# Patient Record
Sex: Male | Born: 1960 | Race: White | Hispanic: No | Smoking: Never smoker
Health system: Southern US, Community
[De-identification: ages and names within clinical notes are randomized; demographics above are authoritative.]

## PROBLEM LIST (undated history)

## (undated) DIAGNOSIS — I10 Essential (primary) hypertension: Secondary | ICD-10-CM

## (undated) DIAGNOSIS — N200 Calculus of kidney: Secondary | ICD-10-CM

## (undated) HISTORY — PX: ANKLE SURGERY: SHX546

## (undated) HISTORY — PX: LITHOTRIPSY: SUR834

---

## 1997-09-20 ENCOUNTER — Emergency Department (HOSPITAL_COMMUNITY): Admission: EM | Admit: 1997-09-20 | Discharge: 1997-09-20 | Payer: Self-pay | Admitting: Internal Medicine

## 1997-09-24 ENCOUNTER — Emergency Department (HOSPITAL_COMMUNITY): Admission: EM | Admit: 1997-09-24 | Discharge: 1997-09-24 | Payer: Self-pay | Admitting: Emergency Medicine

## 2007-10-20 ENCOUNTER — Emergency Department (HOSPITAL_COMMUNITY): Admission: EM | Admit: 2007-10-20 | Discharge: 2007-10-20 | Payer: Self-pay | Admitting: Emergency Medicine

## 2010-03-12 ENCOUNTER — Encounter: Payer: Self-pay | Admitting: Emergency Medicine

## 2010-11-21 LAB — CBC
Platelets: 285
RDW: 13.6

## 2010-11-21 LAB — COMPREHENSIVE METABOLIC PANEL
ALT: 19
Albumin: 3.6
Alkaline Phosphatase: 69
Potassium: 3.9
Sodium: 138
Total Protein: 6.7

## 2010-11-21 LAB — DIFFERENTIAL
Basophils Relative: 1
Eosinophils Absolute: 0.1
Monocytes Absolute: 0.6
Monocytes Relative: 7
Neutro Abs: 5.9

## 2010-11-21 LAB — CARDIAC PANEL(CRET KIN+CKTOT+MB+TROPI)
CK, MB: 0.9
Total CK: 71
Troponin I: 0.03

## 2012-05-20 DIAGNOSIS — C4491 Basal cell carcinoma of skin, unspecified: Secondary | ICD-10-CM

## 2012-05-20 DIAGNOSIS — C439 Malignant melanoma of skin, unspecified: Secondary | ICD-10-CM

## 2012-05-20 HISTORY — DX: Basal cell carcinoma of skin, unspecified: C44.91

## 2012-05-20 HISTORY — DX: Malignant melanoma of skin, unspecified: C43.9

## 2012-10-28 ENCOUNTER — Observation Stay (HOSPITAL_COMMUNITY)
Admission: EM | Admit: 2012-10-28 | Discharge: 2012-10-30 | Disposition: A | Discharge status: Home / Self Care | Payer: Self-pay | Attending: Family Medicine | Admitting: Family Medicine

## 2012-10-28 DIAGNOSIS — I161 Hypertensive emergency: Secondary | ICD-10-CM | POA: Diagnosis present

## 2012-10-28 DIAGNOSIS — E876 Hypokalemia: Secondary | ICD-10-CM | POA: Insufficient documentation

## 2012-10-28 DIAGNOSIS — Z9119 Patient's noncompliance with other medical treatment and regimen: Secondary | ICD-10-CM | POA: Insufficient documentation

## 2012-10-28 DIAGNOSIS — I658 Occlusion and stenosis of other precerebral arteries: Secondary | ICD-10-CM | POA: Insufficient documentation

## 2012-10-28 DIAGNOSIS — Z9114 Patient's other noncompliance with medication regimen: Secondary | ICD-10-CM

## 2012-10-28 DIAGNOSIS — R209 Unspecified disturbances of skin sensation: Secondary | ICD-10-CM | POA: Insufficient documentation

## 2012-10-28 DIAGNOSIS — R42 Dizziness and giddiness: Secondary | ICD-10-CM | POA: Insufficient documentation

## 2012-10-28 DIAGNOSIS — E785 Hyperlipidemia, unspecified: Secondary | ICD-10-CM | POA: Insufficient documentation

## 2012-10-28 DIAGNOSIS — I6529 Occlusion and stenosis of unspecified carotid artery: Secondary | ICD-10-CM | POA: Insufficient documentation

## 2012-10-28 DIAGNOSIS — Z91148 Patient's other noncompliance with medication regimen for other reason: Secondary | ICD-10-CM

## 2012-10-28 DIAGNOSIS — R599 Enlarged lymph nodes, unspecified: Secondary | ICD-10-CM | POA: Insufficient documentation

## 2012-10-28 DIAGNOSIS — M4802 Spinal stenosis, cervical region: Secondary | ICD-10-CM | POA: Insufficient documentation

## 2012-10-28 DIAGNOSIS — R531 Weakness: Secondary | ICD-10-CM | POA: Diagnosis present

## 2012-10-28 DIAGNOSIS — Z79899 Other long term (current) drug therapy: Secondary | ICD-10-CM | POA: Insufficient documentation

## 2012-10-28 DIAGNOSIS — R29898 Other symptoms and signs involving the musculoskeletal system: Secondary | ICD-10-CM | POA: Insufficient documentation

## 2012-10-28 DIAGNOSIS — Z91199 Patient's noncompliance with other medical treatment and regimen due to unspecified reason: Secondary | ICD-10-CM | POA: Insufficient documentation

## 2012-10-28 DIAGNOSIS — G459 Transient cerebral ischemic attack, unspecified: Secondary | ICD-10-CM

## 2012-10-28 DIAGNOSIS — I10 Essential (primary) hypertension: Principal | ICD-10-CM | POA: Insufficient documentation

## 2012-10-28 HISTORY — DX: Essential (primary) hypertension: I10

## 2012-10-29 ENCOUNTER — Emergency Department (HOSPITAL_COMMUNITY): Payer: Self-pay

## 2012-10-29 ENCOUNTER — Observation Stay (HOSPITAL_COMMUNITY): Payer: Self-pay

## 2012-10-29 ENCOUNTER — Encounter (HOSPITAL_COMMUNITY): Payer: Self-pay | Admitting: Family Medicine

## 2012-10-29 ENCOUNTER — Other Ambulatory Visit (HOSPITAL_COMMUNITY): Payer: Self-pay

## 2012-10-29 DIAGNOSIS — G8190 Hemiplegia, unspecified affecting unspecified side: Secondary | ICD-10-CM

## 2012-10-29 DIAGNOSIS — Z91148 Patient's other noncompliance with medication regimen for other reason: Secondary | ICD-10-CM

## 2012-10-29 DIAGNOSIS — R531 Weakness: Secondary | ICD-10-CM | POA: Diagnosis present

## 2012-10-29 DIAGNOSIS — Z9114 Patient's other noncompliance with medication regimen: Secondary | ICD-10-CM

## 2012-10-29 DIAGNOSIS — Z9119 Patient's noncompliance with other medical treatment and regimen: Secondary | ICD-10-CM

## 2012-10-29 DIAGNOSIS — I161 Hypertensive emergency: Secondary | ICD-10-CM | POA: Diagnosis present

## 2012-10-29 DIAGNOSIS — I10 Essential (primary) hypertension: Principal | ICD-10-CM

## 2012-10-29 LAB — BASIC METABOLIC PANEL
BUN: 14 mg/dL (ref 6–23)
Calcium: 8.7 mg/dL (ref 8.4–10.5)
Creatinine, Ser: 1.11 mg/dL (ref 0.50–1.35)
GFR calc Af Amer: 85 mL/min — ABNORMAL LOW (ref >90)
GFR calc non Af Amer: 74 mL/min — ABNORMAL LOW (ref >90)
Potassium: 3.4 mEq/L — ABNORMAL LOW (ref 3.5–5.1)

## 2012-10-29 LAB — CBC WITH DIFFERENTIAL/PLATELET
Basophils Relative: 1 % (ref 0–1)
Eosinophils Absolute: 0.2 10*3/uL (ref 0.0–0.7)
Eosinophils Relative: 4 % (ref 0–5)
Hemoglobin: 13.4 g/dL (ref 13.0–17.0)
MCH: 26.6 pg (ref 26.0–34.0)
MCHC: 32.4 g/dL (ref 30.0–36.0)
MCV: 82.1 fL (ref 78.0–100.0)
Monocytes Absolute: 0.5 10*3/uL (ref 0.1–1.0)
Monocytes Relative: 9 % (ref 3–12)
Neutrophils Relative %: 51 % (ref 43–77)

## 2012-10-29 LAB — LIPID PANEL
Cholesterol: 204 mg/dL — ABNORMAL HIGH (ref 0–200)
Total CHOL/HDL Ratio: 6.8 RATIO
Triglycerides: 242 mg/dL — ABNORMAL HIGH (ref <150)

## 2012-10-29 LAB — URINALYSIS, ROUTINE W REFLEX MICROSCOPIC
Bilirubin Urine: NEGATIVE
Hgb urine dipstick: NEGATIVE
Ketones, ur: NEGATIVE mg/dL
Nitrite: NEGATIVE
Protein, ur: NEGATIVE mg/dL
Urobilinogen, UA: 0.2 mg/dL (ref 0.0–1.0)

## 2012-10-29 LAB — HEMOGLOBIN A1C: Mean Plasma Glucose: 131 mg/dL — ABNORMAL HIGH (ref <117)

## 2012-10-29 MED ORDER — POTASSIUM CHLORIDE CRYS ER 20 MEQ PO TBCR
40.0000 meq | EXTENDED_RELEASE_TABLET | Freq: Once | ORAL | Status: AC
  Administered 2012-10-29: 18:00:00 40 meq via ORAL
  Filled 2012-10-29: qty 2

## 2012-10-29 MED ORDER — ASPIRIN 325 MG PO TABS
325.0000 mg | ORAL_TABLET | Freq: Every day | ORAL | Status: DC
  Administered 2012-10-29 – 2012-10-30 (×2): 325 mg via ORAL
  Filled 2012-10-29 (×2): qty 1

## 2012-10-29 MED ORDER — IOHEXOL 350 MG/ML SOLN
100.0000 mL | Freq: Once | INTRAVENOUS | Status: AC | PRN
  Administered 2012-10-29: 19:00:00 100 mL via INTRAVENOUS

## 2012-10-29 MED ORDER — METOPROLOL TARTRATE 25 MG PO TABS
25.0000 mg | ORAL_TABLET | Freq: Two times a day (BID) | ORAL | Status: DC
  Administered 2012-10-29 – 2012-10-30 (×3): 25 mg via ORAL
  Filled 2012-10-29 (×4): qty 1

## 2012-10-29 MED ORDER — BENAZEPRIL HCL 20 MG PO TABS
20.0000 mg | ORAL_TABLET | Freq: Every day | ORAL | Status: DC
  Administered 2012-10-29 – 2012-10-30 (×2): 20 mg via ORAL
  Filled 2012-10-29 (×2): qty 1

## 2012-10-29 MED ORDER — ASPIRIN 325 MG PO TABS
325.0000 mg | ORAL_TABLET | Freq: Every day | ORAL | Status: DC
  Filled 2012-10-29: qty 1

## 2012-10-29 MED ORDER — ASPIRIN 81 MG PO CHEW
324.0000 mg | CHEWABLE_TABLET | Freq: Once | ORAL | Status: AC
  Administered 2012-10-29: 324 mg via ORAL
  Filled 2012-10-29: qty 4

## 2012-10-29 MED ORDER — ASPIRIN 300 MG RE SUPP
300.0000 mg | Freq: Every day | RECTAL | Status: DC
  Filled 2012-10-29 (×2): qty 1

## 2012-10-29 NOTE — Progress Notes (Signed)
Writer received call from MRI that pt might be too big for facility MRI machine. Writer spoke with pt who stated that he cannot fit into the small MRI machine because his shoulders are too wide and that he was unable to have a MRI "before" because he is too broad. Writer reported this to MRI, and test was cancelled. Writer also reported to MD's [place] and [place],Neurology.

## 2012-10-29 NOTE — Progress Notes (Signed)
Recd's from ER condition stable. No s/sx related to Neuro compromise noted at this time.Continue with pla of care. Await ICU RN to complete the swallowing Eval

## 2012-10-29 NOTE — Progress Notes (Signed)
Subjective: Patient admitted this morning with transient left side numbness and tingling, all symptoms have resolved at this time. CT head is negative for stroke. Neurology has been consulted. MRA could not be performed due to patient's size and unavailability of open MRI in the [facility] system. Filed Vitals:   10/29/12 1338  BP: 150/91  Pulse: 72  Temp: 97.7 F (36.5 C)  Resp: 20    Chest: Clear Bilaterally Heart : S1S2 RRR Abdomen: Soft, nontender Ext : No edema Neuro: Alert, oriented x 3  A/P Transient numbness and weakness of left side now resolved Hypertensive urgency  Patient to undergo CTA of the head and neck to look for possible dissection If negative patient will be discharged home on aspirin We'll continue him on Lotensin and Lopressor    [employer] [facility]/ Pager- [phone]

## 2012-10-29 NOTE — Consult Note (Signed)
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  He was laying down hooking a car up last night when he stood up quickly. He became dizzy followed by a sensation both tingling and weakness in his left arm and leg. this lasted for about 30 minutes and completely resolved.  He currently is asymptomatic.  He denies taking ASA daily.    Date last known well: Date: 10/28/2012 Time last known well: Unable to determine tPA Given: No: symptoms resolved.   Past Medical History  Diagnosis Date  . Hypertension     Past Surgical History  Procedure Laterality Date  . Ankle surgery    . Lithotripsy      Family History  Problem Relation Age of Onset  . Hypertension Mother   . Hypertension Father   . Hypertension Father    Social History:  reports that he has never smoked. He has never used smokeless tobacco. He reports that he does not drink alcohol or use illicit drugs.  Allergies: No Known Allergies  Medications:                                                                                                                           Prior to Admission:  Prescriptions prior to admission  Medication Sig Dispense Refill  . aspirin 325 MG tablet Take 325 mg by mouth daily.      . benazepril (LOTENSIN) 20 MG tablet Take 20 mg by mouth daily.      . metoprolol tartrate (LOPRESSOR) 25 MG tablet Take 25 mg by mouth 2 (two) times daily.       Scheduled: . aspirin  300 mg Rectal Daily   Or  . aspirin  325 mg Oral Daily  . benazepril  20 mg Oral Daily  . metoprolol tartrate  25 mg Oral BID    ROS:  History  obtained from the patient  General ROS: negative for - chills, fatigue, fever, night sweats, weight gain or weight loss Psychological ROS: negative for - behavioral disorder, hallucinations, memory difficulties, mood swings or suicidal ideation Ophthalmic ROS: negative for - blurry vision, double vision, eye pain or loss of vision ENT ROS: negative for - epistaxis, nasal discharge, oral lesions, sore throat, tinnitus or vertigo Allergy and Immunology ROS: negative for - hives or itchy/watery eyes Hematological and Lymphatic ROS: negative for - bleeding problems, bruising or swollen lymph nodes Endocrine ROS: negative for - galactorrhea, hair pattern changes, polydipsia/polyuria or temperature intolerance Respiratory ROS: negative for - cough, hemoptysis, shortness of breath or wheezing Cardiovascular ROS: negative for - chest pain, dyspnea on exertion, edema or irregular heartbeat Gastrointestinal ROS: negative for - abdominal pain, diarrhea, hematemesis, nausea/vomiting or stool incontinence Genito-Urinary ROS: negative for - dysuria, hematuria, incontinence or urinary frequency/urgency Musculoskeletal ROS: negative for - joint swelling or muscular weakness Neurological ROS: as noted in HPI Dermatological ROS: negative for rash and skin lesion changes  Neurologic Examination:                                                                                                      Blood pressure 150/91, pulse 72, temperature 97.7 F (36.5 C), temperature source Oral, resp. rate 20, height 6\' 6"  (1.981 m), weight 157 kg (346 lb 2 oz), SpO2 96.00%.  Mental Status: Alert, oriented, thought content appropriate.  Speech fluent without evidence of aphasia.  Able to follow 3 step commands without difficulty. Cranial Nerves: II: Discs flat bilaterally; Visual fields grossly normal, pupils equal, round, reactive to light and accommodation III,IV, VI: ptosis not present, extra-ocular motions intact  bilaterally V,VII: smile symmetric, facial light touch sensation normal bilaterally VIII: hearing normal bilaterally IX,X: gag reflex present XI: bilateral shoulder shrug XII: midline tongue extension Motor: Right : Upper extremity   5/5    Left:     Upper extremity   5/5  Lower extremity   5/5     Lower extremity   5/5 Tone and bulk:normal tone throughout; no atrophy noted Sensory: Pinprick and light touch intact throughout, bilaterally Deep Tendon Reflexes:  Right: Upper Extremity   Left: Upper extremity   biceps (C-5 to C-6) 2/4   biceps (C-5 to C-6) 2/4 tricep (C7) 2/4    triceps (C7) 2/4 Brachioradialis (C6) 2/4  Brachioradialis (C6) 2/4  Lower Extremity Lower Extremity  quadriceps (L-2 to L-4) 2/4   quadriceps (L-2 to L-4) 2/4 Achilles (S1) 2/4   Achilles (S1) 2/4  Plantars: Right: downgoing   Left: downgoing Cerebellar: normal finger-to-nose,  normal heel-to-shin test Gait: normal CV: pulses palpable throughout    Results for orders placed during the hospital encounter of 10/28/12 (from the past 48 hour(s))  CBC WITH DIFFERENTIAL     Status: None   Collection Time    10/29/12  1:13 AM      Result Value Range   WBC 5.9  4.0 - 10.5 K/uL   RBC 5.03  4.22 - 5.81 MIL/uL   Hemoglobin  13.4  13.0 - 17.0 g/dL   HCT 16.1  09.6 - 04.5 %   MCV 82.1  78.0 - 100.0 fL   MCH 26.6  26.0 - 34.0 pg   MCHC 32.4  30.0 - 36.0 g/dL   RDW 40.9  81.1 - 91.4 %   Platelets 250  150 - 400 K/uL   Neutrophils Relative % 51  43 - 77 %   Neutro Abs 3.0  1.7 - 7.7 K/uL   Lymphocytes Relative 35  12 - 46 %   Lymphs Abs 2.1  0.7 - 4.0 K/uL   Monocytes Relative 9  3 - 12 %   Monocytes Absolute 0.5  0.1 - 1.0 K/uL   Eosinophils Relative 4  0 - 5 %   Eosinophils Absolute 0.2  0.0 - 0.7 K/uL   Basophils Relative 1  0 - 1 %   Basophils Absolute 0.0  0.0 - 0.1 K/uL  BASIC METABOLIC PANEL     Status: Abnormal   Collection Time    10/29/12  1:13 AM      Result Value Range   Sodium 138  135 -  145 mEq/L   Potassium 3.4 (*) 3.5 - 5.1 mEq/L   Chloride 101  96 - 112 mEq/L   CO2 31  19 - 32 mEq/L   Glucose, Bld 131 (*) 70 - 99 mg/dL   BUN 14  6 - 23 mg/dL   Creatinine, Ser 7.82  0.50 - 1.35 mg/dL   Calcium 8.7  8.4 - 95.6 mg/dL   GFR calc non Af Amer 74 (*) >90 mL/min   GFR calc Af Amer 85 (*) >90 mL/min   Comment: (NOTE)     The eGFR has been calculated using the CKD EPI equation.     This calculation has not been validated in all clinical situations.     eGFR's persistently <90 mL/min signify possible Chronic Kidney     Disease.  TROPONIN I     Status: None   Collection Time    10/29/12  1:13 AM      Result Value Range   Troponin I <0.30  <0.30 ng/mL   Comment:            Due to the release kinetics of cTnI,     a negative result within the first hours     of the onset of symptoms does not rule out     myocardial infarction with certainty.     If myocardial infarction is still suspected,     repeat the test at appropriate intervals.  LIPID PANEL     Status: Abnormal   Collection Time    10/29/12  1:13 AM      Result Value Range   Cholesterol 204 (*) 0 - 200 mg/dL   Triglycerides 213 (*) <150 mg/dL   HDL 30 (*) >08 mg/dL   Total CHOL/HDL Ratio 6.8     VLDL 48 (*) 0 - 40 mg/dL   LDL Cholesterol 657 (*) 0 - 99 mg/dL   Comment:            Total Cholesterol/HDL:CHD Risk     Coronary Heart Disease Risk Table                         Men   Women      1/2 Average Risk   3.4   3.3      Average Risk  5.0   4.4      2 X Average Risk   9.6   7.1      3 X Average Risk  23.4   11.0                Use the calculated Patient Ratio     above and the CHD Risk Table     to determine the patient's CHD Risk.                ATP III CLASSIFICATION (LDL):      <100     mg/dL   Optimal      440-102  mg/dL   Near or Above                        Optimal      130-159  mg/dL   Borderline      725-366  mg/dL   High      >440     mg/dL   Very High     Performed at [facility]  URINALYSIS, ROUTINE W REFLEX MICROSCOPIC     Status: None   Collection Time    10/29/12  4:13 AM      Result Value Range   Color, Urine YELLOW  YELLOW   APPearance CLEAR  CLEAR   Specific Gravity, Urine 1.025  1.005 - 1.030   pH 5.5  5.0 - 8.0   Glucose, UA NEGATIVE  NEGATIVE mg/dL   Hgb urine dipstick NEGATIVE  NEGATIVE   Bilirubin Urine NEGATIVE  NEGATIVE   Ketones, ur NEGATIVE  NEGATIVE mg/dL   Protein, ur NEGATIVE  NEGATIVE mg/dL   Urobilinogen, UA 0.2  0.0 - 1.0 mg/dL   Nitrite NEGATIVE  NEGATIVE   Leukocytes, UA NEGATIVE  NEGATIVE   Comment: MICROSCOPIC NOT DONE ON URINES WITH NEGATIVE PROTEIN, BLOOD, LEUKOCYTES, NITRITE, OR GLUCOSE <1000 mg/dL.   Ct Head Wo Contrast  10/29/2012   *RADIOLOGY REPORT*  Clinical Data: Left-sided weakness.  Severe hypertension. Dizziness.  Lightheadedness.  CT HEAD WITHOUT CONTRAST  Technique:  Contiguous axial images were obtained from the base of the skull through the vertex without contrast.  Comparison: 10/20/2007  Findings: The ventricles and sulci are symmetrical without significant effacement, displacement, or dilatation. No mass effect or midline shift. No abnormal extra-axial fluid collections. The grey-white matter junction is distinct. Basal cisterns are not effaced. No acute intracranial hemorrhage. No depressed skull fractures.  Visualized paranasal sinuses and mastoid air cells are not opacified.  No significant changes since the previous study.  IMPRESSION: No acute intracranial abnormalities.   Original Report Authenticated By: Burman Nieves, M.D.   Carotid doppler: Bilateral carotid artery duplex: 1-39% ICA stenosis. Vertebral artery flow is antegrade.   2 D Echo: Pending  MRI not attainable due to body habitus.   Assessment and plan discussed with with attending physician and they are in agreement.    Felicie Morn PA-C [facility] [phone]  10/29/2012, 4:35 PM   Assessment: 52 y.o. male with transient  left face, arm and leg weakness after standing up quickly.  Currently patient is asymptomatic with exception of neck discomfort which has been present since event occurred. CT head was negative and MRI was unable to be obtained due to body habitus. Likely TIA however with neck pain must R/O dissection.   Recommend: 1) Continue ASA 2) CTA head and neck to look for possible dissection (pending) 3) Statin for LDL >100 (  patients is 126) 4) continue BP control 5) Echo  IF work up is negative ok to be discharged home.   Stroke Risk Factors - hypertension  Patient seen and examined together with physician assistant and I concur with the assessment and plan.  Wyatt Portela, MD

## 2012-10-29 NOTE — Progress Notes (Signed)
Spoke with pt concerning PCP appointment on 11/06/12 at 11:30 AM with Dr. Maryln Manuel, [facility]. Pt agreed with appointment.

## 2012-10-29 NOTE — ED Notes (Signed)
Pt given urinal.

## 2012-10-29 NOTE — ED Provider Notes (Addendum)
CSN: [id number]     Arrival date & time 10/28/12  2356 History   First MD Initiated Contact with Patient 10/29/12 0030     Chief Complaint  Patient presents with  . Dizziness  . Extremity Weakness   (Consider location/radiation/quality/duration/timing/severity/associated sxs/prior Treatment) HPI 52 yo male presents to the ER from work with complaint of "not feeling right".  Pt works as tow Naval architect.  Just prior to arrival, patient was getting out of his truck when he had sudden weakness and numbness to the left arm and leg.  Pt did not fall, but had to lean on the truck and right leg to keep from falling.  He reports he felt dizzy, lightheaded, diaphoretic like he was going to pass out.  He reports he has not felt well for the past 2 days with general malaise.  Along with the left sided weakness he had tingling of leg and arm, left face, and pain in left neck.  Pt reports he has had left sided neck pain all day, but much worse just as he got out of the truck.  All symptoms are improving, but have not completely resolved.  No slurred speech, facial droop.  No prior h/o CVA.  Pt has h/o HTN, a cardiologist in [place] refills his BP.  He has not had his BP checked in several months.  Noted to be very high tonight-pt reports this is new and unusual.  No chest pain, no sob.  No fevers or chills.  No missed medications.  Past Medical History  Diagnosis Date  . Hypertension    Past Surgical History  Procedure Laterality Date  . Ankle surgery     No family history on file. History  Substance Use Topics  . Smoking status: Never Smoker   . Smokeless tobacco: Not on file  . Alcohol Use: No    Review of Systems  All other systems reviewed and are negative.  other than listed in hpi   Allergies  Review of patient's allergies indicates no known allergies.  Home Medications   Current Outpatient Rx  Name  Route  Sig  Dispense  Refill  . aspirin 325 MG tablet   Oral   Take 325 mg by  mouth daily.         . benazepril (LOTENSIN) 20 MG tablet   Oral   Take 20 mg by mouth daily.         . metoprolol tartrate (LOPRESSOR) 25 MG tablet   Oral   Take 25 mg by mouth 2 (two) times daily.          BP 158/80  Pulse 76  Temp(Src) 97.8 F (36.6 C) (Oral)  Resp 18  Ht 6\' 6"  (1.981 m)  Wt 310 lb (140.615 kg)  BMI 35.83 kg/m2  SpO2 95% Physical Exam  Nursing note and vitals reviewed. Constitutional: He is oriented to person, place, and time. He appears well-developed and well-nourished. He appears distressed.  HENT:  Head: Normocephalic and atraumatic.  Right Ear: External ear normal.  Left Ear: External ear normal.  Nose: Nose normal.  Mouth/Throat: Oropharynx is clear and moist.  Eyes: Conjunctivae and EOM are normal. Pupils are equal, round, and reactive to light.  Neck: Normal range of motion. Neck supple. No JVD present. No tracheal deviation present. No thyromegaly present.  Cardiovascular: Normal rate, regular rhythm, normal heart sounds and intact distal pulses.  Exam reveals no gallop and no friction rub.   No murmur heard. Pulmonary/Chest: Effort  normal and breath sounds normal. No stridor. No respiratory distress. He has no wheezes. He has no rales. He exhibits no tenderness.  Abdominal: Soft. Bowel sounds are normal. He exhibits no distension and no mass. There is no tenderness. There is no rebound and no guarding.  Musculoskeletal: Normal range of motion. He exhibits no edema and no tenderness.  Lymphadenopathy:    He has no cervical adenopathy.  Neurological: He is alert and oriented to person, place, and time. He has normal reflexes. No cranial nerve deficit. He exhibits normal muscle tone. Coordination normal.  Skin: Skin is warm and dry. No rash noted. No erythema. No pallor.  Psychiatric: He has a normal mood and affect. His behavior is normal. Judgment and thought content normal.    ED Course  Procedures (including critical care time) Labs  Review Labs Reviewed  BASIC METABOLIC PANEL - Abnormal; Notable for the following:    Potassium 3.4 (*)    Glucose, Bld 131 (*)    GFR calc non Af Amer 74 (*)    GFR calc Af Amer 85 (*)    All other components within normal limits  CBC WITH DIFFERENTIAL  TROPONIN I  URINALYSIS, ROUTINE W REFLEX MICROSCOPIC   Imaging Review Ct Head Wo Contrast  10/29/2012   *RADIOLOGY REPORT*  Clinical Data: Left-sided weakness.  Severe hypertension. Dizziness.  Lightheadedness.  CT HEAD WITHOUT CONTRAST  Technique:  Contiguous axial images were obtained from the base of the skull through the vertex without contrast.  Comparison: 10/20/2007  Findings: The ventricles and sulci are symmetrical without significant effacement, displacement, or dilatation. No mass effect or midline shift. No abnormal extra-axial fluid collections. The grey-white matter junction is distinct. Basal cisterns are not effaced. No acute intracranial hemorrhage. No depressed skull fractures.  Visualized paranasal sinuses and mastoid air cells are not opacified.  No significant changes since the previous study.  IMPRESSION: No acute intracranial abnormalities.   Original Report Authenticated By: Burman Nieves, M.D.    Date: 10/29/2012  Rate: 80  Rhythm: normal sinus rhythm  QRS Axis: normal  Intervals: normal  ST/T Wave abnormalities: nonspecific T wave changes  Conduction Disutrbances:none  Narrative Interpretation:   Old EKG Reviewed: none available   MDM   1. TIA (transient ischemic attack)   2. HTN (hypertension)     52 yo male with acute left sided weakness, numbness along with spike in BP.  Concern for TIA.  CT scan, ekg, labs unremarkable.  Bp is improving without intervention.  Will d/w neuro and expect admission for further workup.    Olivia Mackie, MD 10/29/12 1610  Olivia Mackie, MD 10/29/12 [phone]

## 2012-10-29 NOTE — H&P (Signed)
Chief Complaint:  N/t left side  HPI: 52 yo male h/o htn comes in with sudden onset of left sided numbness and tingling/and weakness to left arm and leg earlier tonight.  Came to ed and resolved soon after arrival to ED.  No slurred speech.  He does not take his bp meds , only when he gets a headache.  He does not like the side affects from the bp meds and has no health insurance, no local pcp.  Denies any fevers/n/v/d.  Feels back to normal now.  Takes a daily asa.  No prev h/o cad or cva.  His sister works for a Development worker, international aid in [place].  He has seen that doctor last 2 years ago, who gives him his refills on meds.  Review of Systems:  Positive and negative as per HPI otherwise all other systems are negative  Past Medical History: Past Medical History  Diagnosis Date  . Hypertension    Past Surgical History  Procedure Laterality Date  . Ankle surgery      Medications: Prior to Admission medications   Medication Sig Start Date End Date Taking? Authorizing Provider  aspirin 325 MG tablet Take 325 mg by mouth daily.   Yes Historical Provider, MD  benazepril (LOTENSIN) 20 MG tablet Take 20 mg by mouth daily.   Yes Historical Provider, MD  metoprolol tartrate (LOPRESSOR) 25 MG tablet Take 25 mg by mouth 2 (two) times daily.   Yes Historical Provider, MD    Allergies:  No Known Allergies  Social History:  reports that he has never smoked. He does not have any smokeless tobacco history on file. He reports that he does not drink alcohol or use illicit drugs.  Family History: None   Physical Exam: Filed Vitals:   10/29/12 0137 10/29/12 0200 10/29/12 0202 10/29/12 0230  BP: 146/85 158/80  160/69  Pulse: 78 69 76 77  Temp: 97.8 F (36.6 C)     TempSrc: Oral     Resp: 18 19 18 11   Height:      Weight:      SpO2: 93% 89% 95% 93%   General appearance: alert, cooperative and no distress Head: Normocephalic, without obvious abnormality, atraumatic Eyes: negative Nose: Nares  normal. Septum midline. Mucosa normal. No drainage or sinus tenderness. Neck: no JVD and supple, symmetrical, trachea midline Lungs: clear to auscultation bilaterally Heart: regular rate and rhythm, S1, S2 normal, no murmur, click, rub or gallop Abdomen: soft, non-tender; bowel sounds normal; no masses,  no organomegaly Extremities: extremities normal, atraumatic, no cyanosis or edema Pulses: 2+ and symmetric Skin: Skin color, texture, turgor normal. No rashes or lesions Neurologic: Grossly normal    Labs on Admission:   Recent Labs  10/29/12 0113  NA 138  K 3.4*  CL 101  CO2 31  GLUCOSE 131*  BUN 14  CREATININE 1.11  CALCIUM 8.7    Recent Labs  10/29/12 0113  WBC 5.9  NEUTROABS 3.0  HGB 13.4  HCT 41.3  MCV 82.1  PLT 250    Recent Labs  10/29/12 0113  TROPONINI <0.30   Radiological Exams on Admission: Ct Head Wo Contrast  10/29/2012   *RADIOLOGY REPORT*  Clinical Data: Left-sided weakness.  Severe hypertension. Dizziness.  Lightheadedness.  CT HEAD WITHOUT CONTRAST  Technique:  Contiguous axial images were obtained from the base of the skull through the vertex without contrast.  Comparison: 10/20/2007  Findings: The ventricles and sulci are symmetrical without significant effacement, displacement, or dilatation. No mass  effect or midline shift. No abnormal extra-axial fluid collections. The grey-white matter junction is distinct. Basal cisterns are not effaced. No acute intracranial hemorrhage. No depressed skull fractures.  Visualized paranasal sinuses and mastoid air cells are not opacified.  No significant changes since the previous study.  IMPRESSION: No acute intracranial abnormalities.   Original Report Authenticated By: Burman Nieves, M.D.    Assessment/Plan  52 yo male with tia like symptoms and htn emergency Principal Problem:   Hypertensive emergency Active Problems:   Acute left-sided weakness   Noncompliance with medications  Ck carotid u/s and  cardiac echo.  Will not order mri with no focal neuro deficits, pt is trying to minimize unnecessary costs as he has no health insurance.  Does not want to be transferred to [facility].  Place on tele.  Neuro has been consulted.  Asa.  Ck lipid panel in am.  freq neuro cks.  Full code.  Duane Bishop A 10/29/2012, 3:33 AM

## 2012-10-29 NOTE — Progress Notes (Signed)
Bilateral carotid artery duplex:  1-39% ICA stenosis.  Vertebral artery flow is antegrade.     

## 2012-10-29 NOTE — Progress Notes (Signed)
*  PRELIMINARY RESULTS* Echocardiogram TTE has been performed.  Duane Bishop 10/29/2012, 2:41 PM

## 2012-10-29 NOTE — ED Notes (Signed)
Patient states that he was getting out of the truck and became "lightheaded." states that he doesn't feel quite right. C/o pain at base of neck and states "head feels tingly". Feels like his left arm and left leg are weak.

## 2012-10-30 DIAGNOSIS — G459 Transient cerebral ischemic attack, unspecified: Secondary | ICD-10-CM

## 2012-10-30 MED ORDER — ASPIRIN 81 MG PO TABS: 81.0000 mg | ORAL_TABLET | Freq: Every day | ORAL | Status: AC

## 2012-10-30 MED ORDER — METOPROLOL TARTRATE 25 MG PO TABS: 25.0000 mg | ORAL_TABLET | Freq: Two times a day (BID) | ORAL | Status: DC

## 2012-10-30 MED ORDER — BENAZEPRIL HCL 20 MG PO TABS: 20.0000 mg | ORAL_TABLET | Freq: Every day | ORAL | Status: DC

## 2012-10-30 MED ORDER — ATORVASTATIN CALCIUM 10 MG PO TABS: 20.0000 mg | ORAL_TABLET | Freq: Every day | ORAL | Status: AC

## 2012-10-30 NOTE — Progress Notes (Signed)
CTA neck and brain unremarkable. From a neurological standpoint, patient can be discharge home. Please, call neurology with any questions or concerns.  Wyatt Portela, MD [facility].

## 2012-10-30 NOTE — Progress Notes (Signed)
Chart reviewed. Appointment made for pt with [facility] at 11:30 AM. Pt is aware of appointment.

## 2012-10-30 NOTE — Discharge Summary (Signed)
Physician Discharge Summary  Duane Bishop VWU:[id number] DOB: May 23, 1960 DOA: 10/28/2012  PCP: No primary provider on file.  Admit date: 10/28/2012 Discharge date: 10/30/2012  Time spent: *50 minutes  Recommendations for Outpatient Follow-up:  1. Follow up PCP in one week to check BP   Discharge Diagnoses:  Principal Problem:   Hypertensive emergency Active Problems:   Acute left-sided weakness   Noncompliance with medications   Discharge Condition: *Stable  Diet recommendation: Low salt diet  Filed Weights   10/29/12 0020 10/29/12 0407  Weight: 140.615 kg (310 lb) 157 kg (346 lb 2 oz)    History of present illness:  52 yo male h/o htn comes in with sudden onset of left sided numbness and tingling/and weakness to left arm and leg earlier tonight. Came to ed and resolved soon after arrival to ED. No slurred speech. He does not take his bp meds , only when he gets a headache. He does not like the side affects from the bp meds and has no health insurance, no local pcp. Denies any fevers/n/v/d. Feels back to normal now. Takes a daily asa. No prev h/o cad or cva  Hospital Course:   Transient numbness and weakness of left side now resolved /TIA Patient had negative CT head and CTA of the neck and brain were done as MRI could not be done at [facility], as patient did not fit in the MRI. Both CTA of the neck and brain are negative at this time, and he will be discharged home as all the symptoms are resolved. He will continue with aspirin 81  mg po daily.   Hypertensive urgency  Patient underwent CTA of the head and neck to look for possible dissection  He will continue to take benazepril and Metoprolol at home.  Hyperlipidemia Will start Lipitor 20 mg po daily as LDL> 100  Hypokalemia Potassium was 3.4  Gave one dose of K dur 40 meq po x 1   Procedures:  2D echo- The cavity size was normal. Wall thickness was increased. Systolic function was normal. The estimated ejection  fraction was in the range of 55% to 60%.  Carotid duplex- Bilateral: 1-39% ICA stenosis. Vertebral artery flow is antegrade.      Consultations:  Neurology  Discharge Exam: Filed Vitals:   10/30/12 0552  BP: 148/79  Pulse: 70  Temp: 97.9 F (36.6 C)  Resp: 20    General: Appear in no acute distress Cardiovascular: S1S2 RRR* Respiratory: Clear bilaterally Ext : No edema  Discharge Instructions  Discharge Orders   Future Appointments Provider Department Dept Phone   11/06/2012 11:30 AM Cl[place]ford Cyndie Mull, MD [facility] AND [facility] [phone]   Future Orders Complete By Expires   Diet - low sodium heart healthy  As directed    Increase activity slowly  As directed        Medication List         aspirin 325 MG tablet  Take 325 mg by mouth daily.     atorvastatin 10 MG tablet  Commonly known as:  LIPITOR  Take 2 tablets (20 mg total) by mouth daily.     benazepril 20 MG tablet  Commonly known as:  LOTENSIN  Take 1 tablet (20 mg total) by mouth daily.     metoprolol tartrate 25 MG tablet  Commonly known as:  LOPRESSOR  Take 1 tablet (25 mg total) by mouth 2 (two) times daily.       No Known Allergies  Follow-up Information   Follow up with Standley Dakins, MD On 11/06/2012. (appointment on 11/06/12 at 11:30 AM. Please take Photo ID, all Medications, $20.00 co pay.)    Specialty:  Family Medicine   Contact information:   [address] [place] [address] [phone]        The results of significant diagnostics from this hospitalization (including imaging, microbiology, ancillary and laboratory) are listed below for reference.    Significant Diagnostic Studies: Ct Angio Head W/cm &/or Wo Cm  10/29/2012   CLINICAL DATA:  52 year old male with numbness and weakness on the left side. Hypertensive urgency. Possible dissection.  EXAM: CT ANGIOGRAPHY HEAD AND NECK  TECHNIQUE: Multidetector CT imaging of the head and neck  was performed using the standard protocol during bolus administration of intravenous contrast. Multiplanar CT image reconstructions including MIPs were obtained to evaluate the vascular anatomy. Carotid stenosis measurements (when applicable) are obtained utilizing NASCET criteria, using the distal internal carotid diameter as the denominator.  CONTRAST:  OMNIPAQUE IOHEXOL 350 MG/ML SOLN  COMPARISON:  Head CT without contrast 10/29/2012 and earlier.  FINDINGS: CTA HEAD FINDINGS  No midline shift, ventriculomegaly, mass effect, evidence of mass lesion, intracranial hemorrhage or evidence of cortically based acute infarction. Gray-white matter differentiation is within normal limits throughout the brain. No abnormal enhancement identified. No acute osseous abnormality identified. Visualized scalp soft tissues are within normal limits.  VASCULAR FINDINGS:  Major intracranial venous structures are enhancing.  Fairly codominant distal vertebral arteries are within normal limits. Patent vertebrobasilar junction. Left PICA and AICA origins are within normal limits. No basilar artery stenosis. SCA and PCA origins are within normal limits. Posterior communicating arteries are diminutive or absent. Bilateral PCA branches are within normal limits.  Both ICA siphons are patent. No ICA siphon atherosclerosis identified. Normal carotid termini. Normal MCA and ACA origins. Diminutive or absent anterior communicating artery. Bilateral ACA branches are within normal limits. Left MCA branches are within normal limits. Right MCA branches are within normal limits.  Review of the MIP images confirms the above findings.  CTA NECK FINDINGS  Negative lung apices. Mediastinal lipomatosis. Superimposed right peritracheal lymph node measuring 13 mm in short axis. No other superior mediastinal node or lymphadenopathy identified.  Streak artifact at the thoracic inlet. Negative thyroid, larynx, pharynx, parapharyngeal spaces,  retropharyngeal space, sublingual space, submandibular glands and parotid glands. Visualized orbit soft tissues are within normal limits. Visualized paranasal sinuses and mastoids are clear. No cervical lymphadenopathy.  Bilateral poor posterior dentition. 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 Subsequent spinal stenosis is at least moderate at the C3, C4, and C5-C6 levels. No acute osseous abnormality identified.  VASCULAR FINDINGS:  Streak artifact degrades vessel detail at the thoracic inlet. Three vessel arch configuration. No arch atherosclerosis identified. No great vessel origin stenosis is evident.  Right common carotid artery within normal limits. Mild soft plaque at the right carotid bifurcation. No right ICA origin stenosis. Partially retropharyngeal course of the right ICA which is otherwise negative to the skullbase.  No proximal right subclavian artery stenosis. Grossly normal right vertebral artery origin. Limited anatomic detail of the proximal vertebral arteries due to the streak artifact. Mid and distal cervical right vertebral artery is normal.  No proximal left carotid artery stenosis. Mild soft plaque at the left carotid bifurcation. No left ICA origin stenosis. Mildly tortuous but otherwise negative cervical left ICA beyond its origin.  No proximal left subclavian  artery stenosis. Left vertebral artery origin appears within normal limits allowing for streak artifact which severely affects the left V1 and proximal V2 segments. Otherwise negative cervical left vertebral artery.  Review of the MIP images confirms the above findings.  IMPRESSION: CTA HEAD IMPRESSION  Negative intracranial CTA. Stable and Normal CT appearance of the brain.  CTA NECK IMPRESSION  1. Mild soft atherosclerotic plaque at both carotid bifurcations without significant stenosis and otherwise negative neck CTA allowing for  streak artifact at the thoracic inlet.  2. 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  3.  Poor dentition. Recommend dental followup.  4. Nonspecific solitary mildly enlarged right paratracheal lymph node. Favor benign/inflammatory etiology.   Electronically Signed   By: Augusto Gamble M.D.   On: 10/29/2012 20:01   Ct Head Wo Contrast  10/29/2012   *RADIOLOGY REPORT*  Clinical Data: Left-sided weakness.  Severe hypertension. Dizziness.  Lightheadedness.  CT HEAD WITHOUT CONTRAST  Technique:  Contiguous axial images were obtained from the base of the skull through the vertex without contrast.  Comparison: 10/20/2007  Findings: The ventricles and sulci are symmetrical without significant effacement, displacement, or dilatation. No mass effect or midline shift. No abnormal extra-axial fluid collections. The grey-white matter junction is distinct. Basal cisterns are not effaced. No acute intracranial hemorrhage. No depressed skull fractures.  Visualized paranasal sinuses and mastoid air cells are not opacified.  No significant changes since the previous study.  IMPRESSION: No acute intracranial abnormalities.   Original Report Authenticated By: Burman Nieves, M.D.   Ct Angio Neck W/cm &/or Wo/cm  10/29/2012   CLINICAL DATA:  52 year old male with numbness and weakness on the left side. Hypertensive urgency. Possible dissection.  EXAM: CT ANGIOGRAPHY HEAD AND NECK  TECHNIQUE: Multidetector CT imaging of the head and neck was performed using the standard protocol during bolus administration of intravenous contrast. Multiplanar CT image reconstructions including MIPs were obtained to evaluate the vascular anatomy. Carotid stenosis measurements (when applicable) are obtained utilizing NASCET criteria, using the distal internal carotid diameter as the denominator.  CONTRAST:  OMNIPAQUE IOHEXOL  350 MG/ML SOLN  COMPARISON:  Head CT without contrast 10/29/2012 and earlier.  FINDINGS: CTA HEAD FINDINGS  No midline shift, ventriculomegaly, mass effect, evidence of mass lesion, intracranial hemorrhage or evidence of cortically based acute infarction. Gray-white matter differentiation is within normal limits throughout the brain. No abnormal enhancement identified. No acute osseous abnormality identified. Visualized scalp soft tissues are within normal limits.  VASCULAR FINDINGS:  Major intracranial venous structures are enhancing.  Fairly codominant distal vertebral arteries are within normal limits. Patent vertebrobasilar junction. Left PICA and AICA origins are within normal limits. No basilar artery stenosis. SCA and PCA origins are within normal limits. Posterior communicating arteries are diminutive or absent. Bilateral PCA branches are within normal limits.  Both ICA siphons are patent. No ICA siphon atherosclerosis identified. Normal carotid termini. Normal MCA and ACA origins. Diminutive or absent anterior communicating artery. Bilateral ACA branches are within normal limits. Left MCA branches are within normal limits. Right MCA branches are within normal limits.  Review of the MIP images confirms the above findings.  CTA NECK FINDINGS  Negative lung apices. Mediastinal lipomatosis. Superimposed right peritracheal lymph node measuring 13 mm in short axis. No other superior mediastinal node or lymphadenopathy identified.  Streak artifact at the thoracic inlet. Negative thyroid, larynx, pharynx, parapharyngeal spaces, retropharyngeal space,  sublingual space, submandibular glands and parotid glands. Visualized orbit soft tissues are within normal limits. Visualized paranasal sinuses and mastoids are clear. No cervical lymphadenopathy.  Bilateral poor posterior dentition. 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  Subsequent spinal stenosis is at least moderate at the C3, C4, and C5-C6 levels. No acute osseous abnormality identified.  VASCULAR FINDINGS:  Streak artifact degrades vessel detail at the thoracic inlet. Three vessel arch configuration. No arch atherosclerosis identified. No great vessel origin stenosis is evident.  Right common carotid artery within normal limits. Mild soft plaque at the right carotid bifurcation. No right ICA origin stenosis. Partially retropharyngeal course of the right ICA which is otherwise negative to the skullbase.  No proximal right subclavian artery stenosis. Grossly normal right vertebral artery origin. Limited anatomic detail of the proximal vertebral arteries due to the streak artifact. Mid and distal cervical right vertebral artery is normal.  No proximal left carotid artery stenosis. Mild soft plaque at the left carotid bifurcation. No left ICA origin stenosis. Mildly tortuous but otherwise negative cervical left ICA beyond its origin.  No proximal left subclavian artery stenosis. Left vertebral artery origin appears within normal limits allowing for streak artifact which severely affects the left V1 and proximal V2 segments. Otherwise negative cervical left vertebral artery.  Review of the MIP images confirms the above findings.  IMPRESSION: CTA HEAD IMPRESSION  Negative intracranial CTA. Stable and Normal CT appearance of the brain.  CTA NECK IMPRESSION  1. Mild soft atherosclerotic plaque at both carotid bifurcations without significant stenosis and otherwise negative neck CTA allowing for streak artifact at the thoracic inlet.  2. 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  3.  Poor dentition. Recommend dental followup.  4. Nonspecific solitary mildly enlarged right paratracheal lymph node. Favor benign/inflammatory etiology.   Electronically Signed   By: Augusto Gamble M.D.   On: 10/29/2012 20:01    Microbiology: No results found for this or any previous visit (from the past 240 hour(s)).   Labs: Basic Metabolic Panel:  Recent Labs Lab 10/29/12 0113  NA 138  K 3.4*  CL 101  CO2 31  GLUCOSE 131*  BUN 14  CREATININE 1.11  CALCIUM 8.7   Liver Function Tests: No results found for this basename: AST, ALT, ALKPHOS, BILITOT, PROT, ALBUMIN,  in the last 168 hours No results found for this basename: LIPASE, AMYLASE,  in the last 168 hours No results found for this basename: AMMONIA,  in the last 168 hours CBC:  Recent Labs Lab 10/29/12 0113  WBC 5.9  NEUTROABS 3.0  HGB 13.4  HCT 41.3  MCV 82.1  PLT 250   Cardiac Enzymes:  Recent Labs Lab 10/29/12 0113  TROPONINI <0.30   BNP: BNP (last 3 results) No results found for this basename: PROBNP,  in the last 8760 hours CBG: No results found for this basename: GLUCAP,  in the last 168 hours     Signed:  Marico Buckle S  [facility] 10/30/2012, 11:01 AM

## 2012-11-06 ENCOUNTER — Ambulatory Visit: Payer: Self-pay | Attending: Family Medicine | Admitting: Internal Medicine

## 2012-11-06 ENCOUNTER — Encounter: Payer: Self-pay | Admitting: Internal Medicine

## 2012-11-06 VITALS — BP 147/91 | HR 95 | Temp 98.7°F | Resp 18 | Ht 78.0 in | Wt 345.0 lb

## 2012-11-06 DIAGNOSIS — R7309 Other abnormal glucose: Secondary | ICD-10-CM | POA: Insufficient documentation

## 2012-11-06 DIAGNOSIS — I10 Essential (primary) hypertension: Secondary | ICD-10-CM | POA: Insufficient documentation

## 2012-11-06 DIAGNOSIS — R29898 Other symptoms and signs involving the musculoskeletal system: Secondary | ICD-10-CM | POA: Insufficient documentation

## 2012-11-06 DIAGNOSIS — E669 Obesity, unspecified: Secondary | ICD-10-CM

## 2012-11-06 DIAGNOSIS — Z0000 Encounter for general adult medical examination without abnormal findings: Secondary | ICD-10-CM | POA: Insufficient documentation

## 2012-11-06 DIAGNOSIS — R7303 Prediabetes: Secondary | ICD-10-CM

## 2012-11-06 DIAGNOSIS — E785 Hyperlipidemia, unspecified: Secondary | ICD-10-CM

## 2012-11-06 DIAGNOSIS — R209 Unspecified disturbances of skin sensation: Secondary | ICD-10-CM | POA: Insufficient documentation

## 2012-11-06 MED ORDER — LISINOPRIL-HYDROCHLOROTHIAZIDE 20-12.5 MG PO TABS: 1.0000 | ORAL_TABLET | Freq: Every day | ORAL | Status: DC

## 2012-11-06 MED ORDER — BENAZEPRIL HCL 20 MG PO TABS: 20.0000 mg | ORAL_TABLET | Freq: Every day | ORAL | Status: DC

## 2012-11-06 MED ORDER — METOPROLOL TARTRATE 25 MG PO TABS: 50.0000 mg | ORAL_TABLET | Freq: Two times a day (BID) | ORAL | Status: DC

## 2012-11-06 NOTE — Progress Notes (Signed)
Patient ID: Duane Bishop, male   DOB: 1960-04-14, 52 y.o.   MRN: [id number] PCP:  Jeanann Lewandowsky, MD   Chief Complaint:  Establish care, post hospitalization  HPI: 52 year old obese male who was admitted with a non-hospital recently for hypertensive emergency and left-sided weakness with concern for TIA was discharged on antihypertensive. Denies any headache, blurred vision, dizziness, and, palpitations, shortness of breath, nausea, vomiting, weakness of extremities, abdominal pain, bowel or urinary symptoms. He has been compliant with his medications. He was also noted to have dyslipidemia and prediabetes during his hospitalization.  Allergies: No Known Allergies  Prior to Admission medications   Medication Sig Start Date End Date Taking? Authorizing Provider  aspirin 81 MG tablet Take 1 tablet (81 mg total) by mouth daily. 10/30/12  Yes Meredeth Ide, MD  atorvastatin (LIPITOR) 10 MG tablet Take 2 tablets (20 mg total) by mouth daily. 10/30/12  Yes Meredeth Ide, MD  benazepril (LOTENSIN) 20 MG tablet Take 1 tablet (20 mg total) by mouth daily. 11/06/12  Yes Sahil Milner, MD  metoprolol tartrate (LOPRESSOR) 25 MG tablet Take 2 tablets (50 mg total) by mouth 2 (two) times daily. 11/06/12  Yes Eddie North, MD    Past Medical History  Diagnosis Date  . Hypertension     Past Surgical History  Procedure Laterality Date  . Ankle surgery    . Lithotripsy      Social History:  reports that he has never smoked. He has never used smokeless tobacco. He reports that he does not drink alcohol or use illicit drugs.  Family History  Problem Relation Age of Onset  . Hypertension Mother   . Hypertension Father   . Hypertension Father     Review of Systems:  As outlined in history of present illness  Physical Exam:  Filed Vitals:   11/06/12 1206  BP: 147/91  Pulse: 95  Temp: 98.7 F (37.1 C)  TempSrc: Oral  Resp: 18  Height: 6\' 6"  (1.981 m)  Weight: 345 lb (156.491 kg)   SpO2: 96%    Constitutional: Vital signs reviewed.  Patient is a big obese male in NAD HEENT: No pallor, moist oral mucosa Chest: Clear to auscultation bilaterally, no added sounds CVS: Normal S1 and S2, no murmurs rub or gallop Abdomen: Soft, nontender, nondistended, bowel sounds present Extremities: Warm, trace edema CNS: AAO x3   Labs on Admission:  No results found for this or any previous visit (from the past 48 hour(s)).  Radiological Exams on Admission: Ct Angio Head W/cm &/or Wo Cm  10/29/2012   CLINICAL DATA:  52 year old male with numbness and weakness on the left side. Hypertensive urgency. Possible dissection.  EXAM: CT ANGIOGRAPHY HEAD AND NECK  TECHNIQUE: Multidetector CT imaging of the head and neck was performed using the standard protocol during bolus administration of intravenous contrast. Multiplanar CT image reconstructions including MIPs were obtained to evaluate the vascular anatomy. Carotid stenosis measurements (when applicable) are obtained utilizing NASCET criteria, using the distal internal carotid diameter as the denominator.  CONTRAST:  OMNIPAQUE IOHEXOL 350 MG/ML SOLN  COMPARISON:  Head CT without contrast 10/29/2012 and earlier.  FINDINGS: CTA HEAD FINDINGS  No midline shift, ventriculomegaly, mass effect, evidence of mass lesion, intracranial hemorrhage or evidence of cortically based acute infarction. Gray-white matter differentiation is within normal limits throughout the brain. No abnormal enhancement identified. No acute osseous abnormality identified. Visualized scalp soft tissues are within normal limits.  VASCULAR FINDINGS:  Major intracranial venous structures  are enhancing.  Fairly codominant distal vertebral arteries are within normal limits. Patent vertebrobasilar junction. Left PICA and AICA origins are within normal limits. No basilar artery stenosis. SCA and PCA origins are within normal limits. Posterior communicating arteries are diminutive or  absent. Bilateral PCA branches are within normal limits.  Both ICA siphons are patent. No ICA siphon atherosclerosis identified. Normal carotid termini. Normal MCA and ACA origins. Diminutive or absent anterior communicating artery. Bilateral ACA branches are within normal limits. Left MCA branches are within normal limits. Right MCA branches are within normal limits.  Review of the MIP images confirms the above findings.  CTA NECK FINDINGS  Negative lung apices. Mediastinal lipomatosis. Superimposed right peritracheal lymph node measuring 13 mm in short axis. No other superior mediastinal node or lymphadenopathy identified.  Streak artifact at the thoracic inlet. Negative thyroid, larynx, pharynx, parapharyngeal spaces, retropharyngeal space, sublingual space, submandibular glands and parotid glands. Visualized orbit soft tissues are within normal limits. Visualized paranasal sinuses and mastoids are clear. No cervical lymphadenopathy.  Bilateral poor posterior dentition. 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 Subsequent spinal stenosis is at least moderate at the C3, C4, and C5-C6 levels. No acute osseous abnormality identified.  VASCULAR FINDINGS:  Streak artifact degrades vessel detail at the thoracic inlet. Three vessel arch configuration. No arch atherosclerosis identified. No great vessel origin stenosis is evident.  Right common carotid artery within normal limits. Mild soft plaque at the right carotid bifurcation. No right ICA origin stenosis. Partially retropharyngeal course of the right ICA which is otherwise negative to the skullbase.  No proximal right subclavian artery stenosis. Grossly normal right vertebral artery origin. Limited anatomic detail of the proximal vertebral arteries due to the streak artifact. Mid and distal cervical right vertebral artery is normal.  No proximal left carotid artery  stenosis. Mild soft plaque at the left carotid bifurcation. No left ICA origin stenosis. Mildly tortuous but otherwise negative cervical left ICA beyond its origin.  No proximal left subclavian artery stenosis. Left vertebral artery origin appears within normal limits allowing for streak artifact which severely affects the left V1 and proximal V2 segments. Otherwise negative cervical left vertebral artery.  Review of the MIP images confirms the above findings.  IMPRESSION: CTA HEAD IMPRESSION  Negative intracranial CTA. Stable and Normal CT appearance of the brain.  CTA NECK IMPRESSION  1. Mild soft atherosclerotic plaque at both carotid bifurcations without significant stenosis and otherwise negative neck CTA allowing for streak artifact at the thoracic inlet.  2. 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  3.  Poor dentition. Recommend dental followup.  4. Nonspecific solitary mildly enlarged right paratracheal lymph node. Favor benign/inflammatory etiology.   Electronically Signed   By: Augusto Gamble M.D.   On: 10/29/2012 20:01   Ct Head Wo Contrast  10/29/2012   *RADIOLOGY REPORT*  Clinical Data: Left-sided weakness.  Severe hypertension. Dizziness.  Lightheadedness.  CT HEAD WITHOUT CONTRAST  Technique:  Contiguous axial images were obtained from the base of the skull through the vertex without contrast.  Comparison: 10/20/2007  Findings: The ventricles and sulci are symmetrical without significant effacement, displacement, or dilatation. No mass effect or midline shift. No abnormal extra-axial fluid collections. The grey-white matter junction is distinct. Basal cisterns are not effaced. No acute intracranial hemorrhage. No depressed skull fractures.  Visualized paranasal  sinuses and mastoid air cells are not opacified.  No significant changes since the previous study.  IMPRESSION: No acute  intracranial abnormalities.   Original Report Authenticated By: Burman Nieves, M.D.   Ct Angio Neck W/cm &/or Wo/cm  10/29/2012   CLINICAL DATA:  52 year old male with numbness and weakness on the left side. Hypertensive urgency. Possible dissection.  EXAM: CT ANGIOGRAPHY HEAD AND NECK  TECHNIQUE: Multidetector CT imaging of the head and neck was performed using the standard protocol during bolus administration of intravenous contrast. Multiplanar CT image reconstructions including MIPs were obtained to evaluate the vascular anatomy. Carotid stenosis measurements (when applicable) are obtained utilizing NASCET criteria, using the distal internal carotid diameter as the denominator.  CONTRAST:  OMNIPAQUE IOHEXOL 350 MG/ML SOLN  COMPARISON:  Head CT without contrast 10/29/2012 and earlier.  FINDINGS: CTA HEAD FINDINGS  No midline shift, ventriculomegaly, mass effect, evidence of mass lesion, intracranial hemorrhage or evidence of cortically based acute infarction. Gray-white matter differentiation is within normal limits throughout the brain. No abnormal enhancement identified. No acute osseous abnormality identified. Visualized scalp soft tissues are within normal limits.  VASCULAR FINDINGS:  Major intracranial venous structures are enhancing.  Fairly codominant distal vertebral arteries are within normal limits. Patent vertebrobasilar junction. Left PICA and AICA origins are within normal limits. No basilar artery stenosis. SCA and PCA origins are within normal limits. Posterior communicating arteries are diminutive or absent. Bilateral PCA branches are within normal limits.  Both ICA siphons are patent. No ICA siphon atherosclerosis identified. Normal carotid termini. Normal MCA and ACA origins. Diminutive or absent anterior communicating artery. Bilateral ACA branches are within normal limits. Left MCA branches are within normal limits. Right MCA branches are within normal limits.  Review of the MIP  images confirms the above findings.  CTA NECK FINDINGS  Negative lung apices. Mediastinal lipomatosis. Superimposed right peritracheal lymph node measuring 13 mm in short axis. No other superior mediastinal node or lymphadenopathy identified.  Streak artifact at the thoracic inlet. Negative thyroid, larynx, pharynx, parapharyngeal spaces, retropharyngeal space, sublingual space, submandibular glands and parotid glands. Visualized orbit soft tissues are within normal limits. Visualized paranasal sinuses and mastoids are clear. No cervical lymphadenopathy.  Bilateral poor posterior dentition. 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 Subsequent spinal stenosis is at least moderate at the C3, C4, and C5-C6 levels. No acute osseous abnormality identified.  VASCULAR FINDINGS:  Streak artifact degrades vessel detail at the thoracic inlet. Three vessel arch configuration. No arch atherosclerosis identified. No great vessel origin stenosis is evident.  Right common carotid artery within normal limits. Mild soft plaque at the right carotid bifurcation. No right ICA origin stenosis. Partially retropharyngeal course of the right ICA which is otherwise negative to the skullbase.  No proximal right subclavian artery stenosis. Grossly normal right vertebral artery origin. Limited anatomic detail of the proximal vertebral arteries due to the streak artifact. Mid and distal cervical right vertebral artery is normal.  No proximal left carotid artery stenosis. Mild soft plaque at the left carotid bifurcation. No left ICA origin stenosis. Mildly tortuous but otherwise negative cervical left ICA beyond its origin.  No proximal left subclavian artery stenosis. Left vertebral artery origin appears within normal limits allowing for streak artifact which severely affects the left V1 and proximal V2 segments. Otherwise negative cervical left  vertebral artery.  Review of the MIP images confirms the above findings.  IMPRESSION: CTA HEAD IMPRESSION  Negative  intracranial CTA. Stable and Normal CT appearance of the brain.  CTA NECK IMPRESSION  1. Mild soft atherosclerotic plaque at both carotid bifurcations without significant stenosis and otherwise negative neck CTA allowing for streak artifact at the thoracic inlet.  2. 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  3.  Poor dentition. Recommend dental followup.  4. Nonspecific solitary mildly enlarged right paratracheal lymph node. Favor benign/inflammatory etiology.   Electronically Signed   By: Augusto Gamble M.D.   On: 10/29/2012 20:01    Assessment/Plan  TIA like symptoms Continue baby aspirin and statin  Hypertension Blood pressure still mildly elevated. I will increase his dose of metoprolol to 50 mg twice a day and continue Lotensin. Followup in 2 weeks for blood pressure monitoring.  Dyslipidemia Continue Lipitor. Lipid panel in 3 months  Prediabetes A1c of 6.3. Recheck in 3 months  Patient strongly counseled on lifestyle modifications including diet and medication PALs and exercise to lose weight   Followup in 3 months  Sheryl Towell 11/06/2012, 12:29 PM

## 2012-11-06 NOTE — Progress Notes (Signed)
PT HERE TO ESTABLISH CARE HFU- HTN CRISIS TAKING PRESCRIBED MEDS BP 147/91

## 2012-11-20 ENCOUNTER — Ambulatory Visit: Payer: Self-pay

## 2013-01-23 ENCOUNTER — Emergency Department (HOSPITAL_COMMUNITY)
Admission: EM | Admit: 2013-01-23 | Discharge: 2013-01-23 | Disposition: A | Discharge status: Home / Self Care | Payer: Self-pay | Attending: Emergency Medicine | Admitting: Emergency Medicine

## 2013-01-23 ENCOUNTER — Encounter (HOSPITAL_COMMUNITY): Payer: Self-pay | Admitting: Emergency Medicine

## 2013-01-23 ENCOUNTER — Emergency Department (HOSPITAL_COMMUNITY): Payer: Self-pay

## 2013-01-23 DIAGNOSIS — Z792 Long term (current) use of antibiotics: Secondary | ICD-10-CM | POA: Insufficient documentation

## 2013-01-23 DIAGNOSIS — R110 Nausea: Secondary | ICD-10-CM | POA: Insufficient documentation

## 2013-01-23 DIAGNOSIS — Z87442 Personal history of urinary calculi: Secondary | ICD-10-CM | POA: Insufficient documentation

## 2013-01-23 DIAGNOSIS — Z79899 Other long term (current) drug therapy: Secondary | ICD-10-CM | POA: Insufficient documentation

## 2013-01-23 DIAGNOSIS — I10 Essential (primary) hypertension: Secondary | ICD-10-CM | POA: Insufficient documentation

## 2013-01-23 DIAGNOSIS — R319 Hematuria, unspecified: Secondary | ICD-10-CM | POA: Insufficient documentation

## 2013-01-23 DIAGNOSIS — N12 Tubulo-interstitial nephritis, not specified as acute or chronic: Secondary | ICD-10-CM | POA: Insufficient documentation

## 2013-01-23 DIAGNOSIS — Z7982 Long term (current) use of aspirin: Secondary | ICD-10-CM | POA: Insufficient documentation

## 2013-01-23 DIAGNOSIS — N23 Unspecified renal colic: Secondary | ICD-10-CM | POA: Insufficient documentation

## 2013-01-23 HISTORY — DX: Calculus of kidney: N20.0

## 2013-01-23 LAB — URINE MICROSCOPIC-ADD ON

## 2013-01-23 LAB — BASIC METABOLIC PANEL
BUN: 11 mg/dL (ref 6–23)
CO2: 28 mEq/L (ref 19–32)
Chloride: 102 mEq/L (ref 96–112)
Creatinine, Ser: 0.97 mg/dL (ref 0.50–1.35)
Glucose, Bld: 108 mg/dL — ABNORMAL HIGH (ref 70–99)

## 2013-01-23 LAB — CBC
HCT: 44.7 % (ref 39.0–52.0)
Hemoglobin: 14.2 g/dL (ref 13.0–17.0)
MCV: 81.9 fL (ref 78.0–100.0)
RBC: 5.46 MIL/uL (ref 4.22–5.81)
WBC: 7.5 10*3/uL (ref 4.0–10.5)

## 2013-01-23 LAB — URINALYSIS, ROUTINE W REFLEX MICROSCOPIC
Bilirubin Urine: NEGATIVE
Glucose, UA: NEGATIVE mg/dL
Specific Gravity, Urine: 1.014 (ref 1.005–1.030)

## 2013-01-23 MED ORDER — CIPROFLOXACIN HCL 500 MG PO TABS: 500.0000 mg | ORAL_TABLET | Freq: Two times a day (BID) | ORAL | Status: DC

## 2013-01-23 MED ORDER — HYDROMORPHONE HCL PF 1 MG/ML IJ SOLN
1.0000 mg | Freq: Once | INTRAMUSCULAR | Status: AC
  Administered 2013-01-23: 1 mg via INTRAVENOUS
  Filled 2013-01-23: qty 1

## 2013-01-23 MED ORDER — DEXTROSE 5 % IV SOLN
1.0000 g | Freq: Once | INTRAVENOUS | Status: AC
  Administered 2013-01-23: 1 g via INTRAVENOUS
  Filled 2013-01-23: qty 10

## 2013-01-23 MED ORDER — SODIUM CHLORIDE 0.9 % IV BOLUS (SEPSIS)
1000.0000 mL | Freq: Once | INTRAVENOUS | Status: AC
  Administered 2013-01-23: 1000 mL via INTRAVENOUS

## 2013-01-23 MED ORDER — OXYCODONE-ACETAMINOPHEN 5-325 MG PO TABS: 1.0000 | ORAL_TABLET | Freq: Four times a day (QID) | ORAL | Status: DC | PRN

## 2013-01-23 MED ORDER — ONDANSETRON HCL 4 MG/2ML IJ SOLN
4.0000 mg | Freq: Once | INTRAMUSCULAR | Status: AC
  Administered 2013-01-23: 4 mg via INTRAVENOUS
  Filled 2013-01-23: qty 2

## 2013-01-23 NOTE — ED Notes (Signed)
Pt states pain started like kidney stone yesterday, today with blood in urine, pain yesterday was lower back, this morning lower abdominal, c/o burning and pain with urination with "clumps"

## 2013-01-23 NOTE — ED Notes (Signed)
He tells me he has had left flank pain which has migrated to bladder area today.  He states the pain began some 2-3 days ago; and he at times has noted some minimal hematuria.  He states he had similar symptomology in 2001 at which time he underwent cystoscopy and lithotripsy in [place], [place].

## 2013-01-23 NOTE — ED Provider Notes (Signed)
CSN: [id number]     Arrival date & time 01/23/13  1211 History   First MD Initiated Contact with Patient 01/23/13 1230     Chief Complaint  Patient presents with  . Hematuria  . Urinary Frequency  . Abdominal Pain   (Consider location/radiation/quality/duration/timing/severity/associated sxs/prior Treatment) HPI Pt presenting with c/o left lower abdominal pain.  Pain began last night.  This morning he took oxycodone and stood in hot shower to try to relieve pain.  STates pain feels "like a hot rod being stuck inside me".  No fever/chills.  Has had some nausea but no vomiting.  Has seen blood in urine with occasional clots of blood since last night.  Feels urinary frequency.  No difficulty urinating.  Also burning with urination.  There are no other associated systemic symptoms, there are no other alleviating or modifying factors.   Past Medical History  Diagnosis Date  . Hypertension   . Kidney stones    Past Surgical History  Procedure Laterality Date  . Ankle surgery    . Lithotripsy     Family History  Problem Relation Age of Onset  . Hypertension Mother   . Hypertension Father   . Hypertension Father    History  Substance Use Topics  . Smoking status: Never Smoker   . Smokeless tobacco: Never Used  . Alcohol Use: No    Review of Systems ROS reviewed and all otherwise negative except for mentioned in HPI  Allergies  Review of patient's allergies indicates no known allergies.  Home Medications   Current Outpatient Rx  Name  Route  Sig  Dispense  Refill  . aspirin 81 MG tablet   Oral   Take 1 tablet (81 mg total) by mouth daily.   30 tablet   2   . atorvastatin (LIPITOR) 10 MG tablet   Oral   Take 2 tablets (20 mg total) by mouth daily.   30 tablet   2   . benazepril (LOTENSIN) 20 MG tablet   Oral   Take 1 tablet (20 mg total) by mouth daily.   30 tablet   3   . metoprolol tartrate (LOPRESSOR) 25 MG tablet   Oral   Take 50 mg by mouth 2 (two) times  daily.         . ciprofloxacin (CIPRO) 500 MG tablet   Oral   Take 1 tablet (500 mg total) by mouth 2 (two) times daily.   28 tablet   0   . oxyCODONE-acetaminophen (PERCOCET/ROXICET) 5-325 MG per tablet   Oral   Take 1-2 tablets by mouth every 6 (six) hours as needed for severe pain.   15 tablet   0    BP 139/69  Pulse 67  Temp(Src) 98 F (36.7 C) (Oral)  Resp 18  SpO2 100% Vitals reviewed Physical Exam Physical Examination: General appearance - alert, well appearing, and in no distress Mental status - alert, oriented to person, place, and time Eyes - no conjunctival injection, no scleral icterus Mouth - mucous membranes moist, pharynx normal without lesions Chest - clear to auscultation, no wheezes, rales or rhonchi, symmetric air entry Heart - normal rate, regular rhythm, normal S1, S2, no murmurs, rubs, clicks or gallops Abdomen - soft, nontender, nabs, nondistended, no masses or organomegaly Back exam - full range of motion, no midline tenderness, no CVA tenderness Extremities - peripheral pulses normal, no pedal edema, no clubbing or cyanosis Skin - normal coloration and turgor, no rashes  ED Course  Procedures (including critical care time) Labs Review Labs Reviewed  URINALYSIS, ROUTINE W REFLEX MICROSCOPIC - Abnormal; Notable for the following:    Color, Urine RED (*)    APPearance TURBID (*)    Hgb urine dipstick LARGE (*)    Protein, ur >300 (*)    Leukocytes, UA LARGE (*)    All other components within normal limits  BASIC METABOLIC PANEL - Abnormal; Notable for the following:    Potassium 3.2 (*)    Glucose, Bld 108 (*)    All other components within normal limits  URINE MICROSCOPIC-ADD ON - Abnormal; Notable for the following:    Bacteria, UA FEW (*)    All other components within normal limits  URINE CULTURE  CBC   Imaging Review Ct Abdomen Pelvis Wo Contrast  01/23/2013   CLINICAL DATA:  Right lower quadrant pain  EXAM: CT ABDOMEN AND PELVIS  WITHOUT CONTRAST  TECHNIQUE: Multidetector CT imaging of the abdomen and pelvis was performed following the standard protocol without intravenous contrast.  COMPARISON:  None.  FINDINGS: Lower Chest: The lung bases are clear. Visualized cardiac structures are within normal limits for size. No pericardial effusion. Unremarkable visualized distal thoracic esophagus.  Abdomen: Unenhanced CT was performed per clinician order. Lack of IV contrast limits sensitivity and specificity, especially for evaluation of abdominal/pelvic solid viscera. Within these limitations, unremarkable CT appearance of the stomach, duodenum, spleen, and adrenal glands. Diffuse fatty atrophy of the pancreas. Well-circumscribed hypo attenuating focus in the posterior and superior aspect of hepatic segment 2 is incompletely evaluated in the absence of intravenous contrast material. Statistically, this is highly likely a benign cyst. Gallbladder is unremarkable. No intra or extrahepatic biliary ductal dilatation.  Mild fullness of the right renal collecting system without true hydronephrosis. Additionally, there is mild stranding of the peripelvic fat on the right. Inflammatory stranding continues inferiorly along the course of the ureter. No definite nephro or ureterolithiasis identified.  No evidence of obstruction or focal bowel wall thickening. Normal appendix in the right lower quadrant. The terminal ileum is unremarkable. Colonic diverticular disease without CT evidence of active inflammation.  Pelvis: The bladder is decompressed but thick walled. Additionally, there is mild interstitial stranding in the perivesicular fat.  Bones/Soft Tissues: No acute fracture or aggressive appearing lytic or blastic osseous lesion. Multilevel degenerative disc disease. Mild lower lumbar facet arthropathy.  Vascular: Limited evaluation in the absence of intravenous contrast. No significant atherosclerotic vascular disease, aneurysmal dilatation or acute  abnormality.  IMPRESSION: 1. Mild fullness of the right renal collecting system with inflammatory stranding in the peripelvic fat and around the right kidney extending inferiorly along the course of the ureter to the bladder. Additionally, the bladder is diffusely thick walled and there is mild interstitial stranding in the perivesicular fat. No definite renal or ureteral stone identified. Given of the bladder changes, the overall findings are most concerning for complicated cystitis with ascending infection into the right kidney (pyonephrosis, possible pyelonephritis). The changes in the kidney and right ureter could reflect recent passage of a renal stone, but this should not cause the abnormalities identified in the bladder wall and perivesicular fat. Recommend correlation with urinalysis. 2. Diffuse fatty atrophy of the pancreas. 3. Mild colonic diverticulosis without active inflammation. 4. Additional incidental findings as above.   Electronically Signed   By: Malachy Moan M.D.   On: 01/23/2013 14:04    EKG Interpretation   None       MDM   1. Pyelonephritis  2. Hematuria   3. Ureteral colic    Pt presenting with right lower abdominal pain, dysuria and hematuria.  Pt is overall nontoxic and well hydrated in appearance.  Clinicially patient looks to be passing ureteral stone. CT scan shows possible pyelonephritis arising from a cystitis with bladder inflammation.  Will treat with abx and pt to f/u with urology for further workup.  Pt is afebrile, no leukocytosis and normal renal function.  Discharged with strict return precautions.  Pt agreeable with plan.    Ethelda Chick, MD 01/23/13 [phone]

## 2013-01-25 LAB — URINE CULTURE: Colony Count: 10000

## 2014-06-30 ENCOUNTER — Ambulatory Visit (INDEPENDENT_AMBULATORY_CARE_PROVIDER_SITE_OTHER): Payer: Self-pay | Admitting: Physician Assistant

## 2014-06-30 VITALS — BP 140/86 | HR 81 | Temp 98.2°F | Resp 18 | Ht 78.0 in | Wt 341.0 lb

## 2014-06-30 DIAGNOSIS — Z0000 Encounter for general adult medical examination without abnormal findings: Secondary | ICD-10-CM

## 2014-06-30 DIAGNOSIS — I10 Essential (primary) hypertension: Secondary | ICD-10-CM | POA: Insufficient documentation

## 2014-06-30 NOTE — Progress Notes (Signed)
Subjective:    Patient ID: Duane Bishop, male    DOB: May 14, 1960, 54 y.o.   MRN: [id number]  Chief Complaint  Patient presents with  . Employment Physical    dot    Patient Active Problem List   Diagnosis Date Noted  . Essential hypertension 06/30/2014  . Other and unspecified hyperlipidemia 11/06/2012  . Prediabetes 11/06/2012  . Obesity, unspecified 11/06/2012  . Acute left-sided weakness 10/29/2012  . Noncompliance with medications 10/29/2012   Prior to Admission medications   Medication Sig Start Date End Date Taking? Authorizing Provider  aspirin 81 MG tablet Take 1 tablet (81 mg total) by mouth daily. 10/30/12  Yes Oswald Hillock, MD  metoprolol tartrate (LOPRESSOR) 25 MG tablet Take 50 mg by mouth 2 (two) times daily.   Yes Historical Provider, MD  atorvastatin (LIPITOR) 10 MG tablet Take 2 tablets (20 mg total) by mouth daily. Patient not taking: Reported on 06/30/2014 10/30/12   Oswald Hillock, MD   Medications, allergies, past medical history, surgical history, family history, social history and problem list reviewed and updated.  HPI  69 yom presents needing DOT exam.   Has hx htn - takes metoprolol bid. Trace protein noted on ua. Pt instructed to f/u with pcp to ensure this does not progress. Discussed importance of wt loss to prevent future issues that may impact license.   No concerning findings on health hx form. Typically gets one year cards due to his htn.   Review of Systems Negative per healthy history form.     Objective:   Physical Exam  Constitutional: He is oriented to person, place, and time. He appears well-developed and well-nourished.  Non-toxic appearance. He does not have a sickly appearance. He does not appear ill. No distress.  BP 140/86 mmHg  Pulse 81  Temp(Src) 98.2 F (36.8 C) (Oral)  Resp 18  Ht 6\' 6"  (1.981 m)  Wt 341 lb (154.677 kg)  BMI 39.41 kg/m2  SpO2 97%   HENT:  Right Ear: Tympanic membrane normal.  Left Ear: Tympanic  membrane normal.  Nose: Nose normal.  Mouth/Throat: Uvula is midline, oropharynx is clear and moist and mucous membranes are normal.  Eyes: Conjunctivae and EOM are normal. Pupils are equal, round, and reactive to light.  Neck: Trachea normal and normal range of motion. Carotid bruit is not present. No thyroid mass and no thyromegaly present.  Cardiovascular: Normal rate, regular rhythm and normal heart sounds.   Pulses:      Dorsalis pedis pulses are 2+ on the right side, and 2+ on the left side.  Pulmonary/Chest: Effort normal and breath sounds normal.  Abdominal: Soft. Normal appearance and bowel sounds are normal. There is no hepatosplenomegaly. There is no tenderness. There is no CVA tenderness.  Obese.   Musculoskeletal: Normal range of motion.  Neurological: He is alert and oriented to person, place, and time. He has normal strength. No cranial nerve deficit or sensory deficit. He displays a negative Romberg sign.  Normal rapid alternating movements, normal finger to nose.   Psychiatric: He has a normal mood and affect. His speech is normal and behavior is normal.      Assessment & Plan:   80 yom presents needing DOT exam.   Annual physical exam Essential hypertension --normal exam, discussed obesity and trace proteinuria --one year card due to [place], PA-C Physician Assistant[facility]  06/30/2014 7:28 PM

## 2018-02-09 ENCOUNTER — Emergency Department (HOSPITAL_COMMUNITY): Payer: Self-pay

## 2018-02-09 ENCOUNTER — Other Ambulatory Visit: Payer: Self-pay

## 2018-02-09 ENCOUNTER — Encounter (HOSPITAL_COMMUNITY): Payer: Self-pay

## 2018-02-09 ENCOUNTER — Emergency Department (HOSPITAL_COMMUNITY)
Admission: EM | Admit: 2018-02-09 | Discharge: 2018-02-09 | Disposition: A | Discharge status: Home / Self Care | Payer: Self-pay | Attending: Emergency Medicine | Admitting: Emergency Medicine

## 2018-02-09 DIAGNOSIS — M25571 Pain in right ankle and joints of right foot: Secondary | ICD-10-CM | POA: Insufficient documentation

## 2018-02-09 DIAGNOSIS — R0602 Shortness of breath: Secondary | ICD-10-CM | POA: Insufficient documentation

## 2018-02-09 DIAGNOSIS — I10 Essential (primary) hypertension: Secondary | ICD-10-CM | POA: Insufficient documentation

## 2018-02-09 DIAGNOSIS — M25572 Pain in left ankle and joints of left foot: Secondary | ICD-10-CM | POA: Insufficient documentation

## 2018-02-09 DIAGNOSIS — Z7982 Long term (current) use of aspirin: Secondary | ICD-10-CM | POA: Insufficient documentation

## 2018-02-09 DIAGNOSIS — M1990 Unspecified osteoarthritis, unspecified site: Secondary | ICD-10-CM | POA: Insufficient documentation

## 2018-02-09 DIAGNOSIS — Z79899 Other long term (current) drug therapy: Secondary | ICD-10-CM | POA: Insufficient documentation

## 2018-02-09 LAB — CBC WITH DIFFERENTIAL/PLATELET
Abs Immature Granulocytes: 0.07 10*3/uL (ref 0.00–0.07)
Basophils Absolute: 0 10*3/uL (ref 0.0–0.1)
Basophils Relative: 0 %
EOS ABS: 0.1 10*3/uL (ref 0.0–0.5)
EOS PCT: 1 %
HCT: 40.1 % (ref 39.0–52.0)
Hemoglobin: 12.1 g/dL — ABNORMAL LOW (ref 13.0–17.0)
Immature Granulocytes: 1 %
Lymphocytes Relative: 10 %
Lymphs Abs: 1.1 10*3/uL (ref 0.7–4.0)
MCH: 25.7 pg — ABNORMAL LOW (ref 26.0–34.0)
MCHC: 30.2 g/dL (ref 30.0–36.0)
MCV: 85.3 fL (ref 80.0–100.0)
MONO ABS: 1 10*3/uL (ref 0.1–1.0)
Monocytes Relative: 9 %
Neutro Abs: 8.2 10*3/uL — ABNORMAL HIGH (ref 1.7–7.7)
Neutrophils Relative %: 79 %
Platelets: 236 10*3/uL (ref 150–400)
RBC: 4.7 MIL/uL (ref 4.22–5.81)
RDW: 14.1 % (ref 11.5–15.5)
WBC: 10.4 10*3/uL (ref 4.0–10.5)
nRBC: 0 % (ref 0.0–0.2)

## 2018-02-09 LAB — BASIC METABOLIC PANEL
Anion gap: 10 (ref 5–15)
BUN: 12 mg/dL (ref 6–20)
CO2: 27 mmol/L (ref 22–32)
Calcium: 8.4 mg/dL — ABNORMAL LOW (ref 8.9–10.3)
Chloride: 98 mmol/L (ref 98–111)
Creatinine, Ser: 1.13 mg/dL (ref 0.61–1.24)
GFR calc Af Amer: >60 mL/min (ref >60)
GFR calc non Af Amer: >60 mL/min (ref >60)
Glucose, Bld: 196 mg/dL — ABNORMAL HIGH (ref 70–99)
Potassium: 3.9 mmol/L (ref 3.5–5.1)
Sodium: 135 mmol/L (ref 135–145)

## 2018-02-09 MED ORDER — AMLODIPINE BESYLATE 5 MG PO TABS
5.0000 mg | ORAL_TABLET | Freq: Every day | ORAL | Status: DC
  Administered 2018-02-09: 5 mg via ORAL
  Filled 2018-02-09: qty 1

## 2018-02-09 MED ORDER — HYDROCODONE-ACETAMINOPHEN 5-325 MG PO TABS: 1.0000 | ORAL_TABLET | ORAL | 0 refills | Status: AC | PRN

## 2018-02-09 MED ORDER — LISINOPRIL-HYDROCHLOROTHIAZIDE 20-12.5 MG PO TABS: 1.0000 | ORAL_TABLET | Freq: Every day | ORAL | Status: DC

## 2018-02-09 MED ORDER — LORAZEPAM 2 MG/ML IJ SOLN
0.5000 mg | Freq: Once | INTRAMUSCULAR | Status: AC
  Administered 2018-02-09: 0.5 mg via INTRAVENOUS
  Filled 2018-02-09: qty 1

## 2018-02-09 MED ORDER — CARVEDILOL 6.25 MG PO TABS
12.5000 mg | ORAL_TABLET | Freq: Every day | ORAL | Status: DC
  Administered 2018-02-09: 12.5 mg via ORAL
  Filled 2018-02-09: qty 2

## 2018-02-09 MED ORDER — METHYLPREDNISOLONE SODIUM SUCC 125 MG IJ SOLR
125.0000 mg | Freq: Once | INTRAMUSCULAR | Status: AC
  Administered 2018-02-09: 125 mg via INTRAVENOUS
  Filled 2018-02-09: qty 2

## 2018-02-09 MED ORDER — IOPAMIDOL (ISOVUE-370) INJECTION 76%
100.0000 mL | Freq: Once | INTRAVENOUS | Status: AC | PRN
  Administered 2018-02-09: 100 mL via INTRAVENOUS

## 2018-02-09 MED ORDER — LISINOPRIL 20 MG PO TABS
20.0000 mg | ORAL_TABLET | Freq: Every day | ORAL | Status: DC
  Administered 2018-02-09: 20 mg via ORAL
  Filled 2018-02-09: qty 1

## 2018-02-09 MED ORDER — METHOCARBAMOL 500 MG PO TABS: 500.0000 mg | ORAL_TABLET | Freq: Two times a day (BID) | ORAL | 0 refills | Status: AC

## 2018-02-09 MED ORDER — SODIUM CHLORIDE (PF) 0.9 % IJ SOLN
INTRAMUSCULAR | Status: AC
  Filled 2018-02-09: qty 50

## 2018-02-09 MED ORDER — CARVEDILOL 6.25 MG PO TABS: 6.2500 mg | ORAL_TABLET | Freq: Every day | ORAL | Status: DC

## 2018-02-09 MED ORDER — IOPAMIDOL (ISOVUE-370) INJECTION 76%
INTRAVENOUS | Status: AC
  Filled 2018-02-09: qty 100

## 2018-02-09 MED ORDER — MORPHINE SULFATE (PF) 4 MG/ML IV SOLN
6.0000 mg | Freq: Once | INTRAVENOUS | Status: AC
Start: 2018-02-09 — End: 2018-02-09
  Administered 2018-02-09: 6 mg via INTRAVENOUS
  Filled 2018-02-09: qty 2

## 2018-02-09 MED ORDER — HYDROCHLOROTHIAZIDE 12.5 MG PO CAPS
12.5000 mg | ORAL_CAPSULE | Freq: Every day | ORAL | Status: DC
  Administered 2018-02-09: 12.5 mg via ORAL
  Filled 2018-02-09: qty 1

## 2018-02-09 MED ORDER — PREDNISONE 10 MG (21) PO TBPK: ORAL_TABLET | Freq: Every day | ORAL | 0 refills | Status: AC

## 2018-02-09 NOTE — ED Triage Notes (Addendum)
Patient BIB EMS from sisters house with complaints of bilateral foot pain and swelling. Patient reports unable to walk due to edema to bilateral feet. Patient has history of gout x1. Patient hyptertensive for EMS 210/120. Patient reports he hasnt taken his antihypertensive meds due to being at his sisters house. EMS HR 112, Oxygen 90% RA.

## 2018-02-09 NOTE — ED Notes (Signed)
Bed: WA07 Expected date:  Expected time:  Means of arrival:  Comments: EMS 

## 2018-02-09 NOTE — ED Provider Notes (Signed)
[facility] DEPT Provider Note   CSN: [id number] Arrival date & time: 02/09/18  9449     History   Chief Complaint Chief Complaint  Patient presents with  . Foot Pain  . Shortness of Breath    HPI Duane Bishop is a 57 y.o. male.  57 year old male with history of osteoarthritis presents with pain to his left hip as well as bilateral ankles.  Patient states that he has had increasing peripheral edema.  Does have a history of gout.  He also has hypertension and has not taken his daily medications.  He denies any trauma.  Characterizes the pain to his ankles and hips as dull and worse with any movement.  States that today when he tried to walk he has most pain in his left hip that he had to sit down.  Denies any distal numbness or tingling to his left foot.  No foot drop noted.  Symptoms better with remaining still.  Called EMS and was transported here.     Past Medical History:  Diagnosis Date  . Hypertension   . Kidney stones     Patient Active Problem List   Diagnosis Date Noted  . Essential hypertension 06/30/2014  . Other and unspecified hyperlipidemia 11/06/2012  . Prediabetes 11/06/2012  . Obesity, unspecified 11/06/2012  . Acute left-sided weakness 10/29/2012  . Noncompliance with medications 10/29/2012    Past Surgical History:  Procedure Laterality Date  . ANKLE SURGERY    . LITHOTRIPSY          Home Medications    Prior to Admission medications   Medication Sig Start Date End Date Taking? Authorizing Provider  aspirin 81 MG tablet Take 1 tablet (81 mg total) by mouth daily. 10/30/12   Oswald Hillock, MD  atorvastatin (LIPITOR) 10 MG tablet Take 2 tablets (20 mg total) by mouth daily. Patient not taking: Reported on 06/30/2014 10/30/12   Oswald Hillock, MD  metoprolol tartrate (LOPRESSOR) 25 MG tablet Take 50 mg by mouth 2 (two) times daily.    [provider]    Family History Family History  Problem Relation  Age of Onset  . Hypertension Mother   . Hypertension Father     Social History Social History   Tobacco Use  . Smoking status: Never Smoker  . Smokeless tobacco: Never Used  Substance Use Topics  . Alcohol use: No  . Drug use: No     Allergies   Patient has no known allergies.   Review of Systems Review of Systems  All other systems reviewed and are negative.    Physical Exam Updated Vital Signs BP (!) 167/95   Pulse (!) 104   Temp 100 F (37.8 C)   Resp 20   SpO2 94%   Physical Exam Vitals signs and nursing note reviewed.  Constitutional:      General: He is not in acute distress.    Appearance: Normal appearance. He is well-developed. He is not toxic-appearing.  HENT:     Head: Normocephalic and atraumatic.  Eyes:     General: Lids are normal.     Conjunctiva/sclera: Conjunctivae normal.     Pupils: Pupils are equal, round, and reactive to light.  Neck:     Musculoskeletal: Normal range of motion and neck supple.     Thyroid: No thyroid mass.     Trachea: No tracheal deviation.  Cardiovascular:     Rate and Rhythm: Regular rhythm. Tachycardia present.  Heart sounds: Normal heart sounds. No murmur. No gallop.   Pulmonary:     Effort: Pulmonary effort is normal. No respiratory distress.     Breath sounds: Normal breath sounds. No stridor. No decreased breath sounds, wheezing, rhonchi or rales.  Abdominal:     General: Bowel sounds are normal. There is no distension.     Palpations: Abdomen is soft.     Tenderness: There is no abdominal tenderness. There is no rebound.  Musculoskeletal: Normal range of motion.        General: No tenderness.       Legs:  Lymphadenopathy:     Comments: 3+ bilateral lower extremity pitting edema  Skin:    General: Skin is warm and dry.     Findings: No abrasion or rash.  Neurological:     Mental Status: He is alert and oriented to person, place, and time.     GCS: GCS eye subscore is 4. GCS verbal subscore is 5.  GCS motor subscore is 6.     Cranial Nerves: No cranial nerve deficit.     Sensory: No sensory deficit.  Psychiatric:        Speech: Speech normal.        Behavior: Behavior normal.      ED Treatments / Results  Labs (all labs ordered are listed, but only abnormal results are displayed) Labs Reviewed  BASIC METABOLIC PANEL  CBC WITH DIFFERENTIAL/PLATELET    EKG None  Radiology Dg Chest 2 View  Result Date: 02/09/2018 CLINICAL DATA:  Shortness of breath EXAM: CHEST - 2 VIEW COMPARISON:  None FINDINGS: There is blunting of the right lateral costophrenic angle. There is no focal parenchymal opacity. There is no pleural effusion or pneumothorax. The heart and mediastinal contours are unremarkable. The osseous structures are unremarkable. IMPRESSION: No active cardiopulmonary disease. Electronically Signed   By: Kathreen Devoid   On: 02/09/2018 09:48    Procedures Procedures (including critical care time)  Medications Ordered in ED Medications  methylPREDNISolone sodium succinate (SOLU-MEDROL) 125 mg/2 mL injection 125 mg (has no administration in time range)  LORazepam (ATIVAN) injection 0.5 mg (has no administration in time range)  morphine 4 MG/ML injection 6 mg (has no administration in time range)     Initial Impression / Assessment and Plan / ED Course  I have reviewed the triage vital signs and the nursing notes.  Pertinent labs & imaging results that were available during my care of the patient were reviewed by me and considered in my medical decision making (see chart for details).     Patient treated for likely arthritis here and does feel better.  Because of patient's reported low pulse oximetry he had a chest CT which was negative for PE.  Suspect is from his body habitus as a cause of it.  Patient is morbidly obese.  Will discharge to home on course of prednisone  Final Clinical Impressions(s) / ED Diagnoses   Final diagnoses:  None    ED Discharge Orders      None       Lacretia Leigh, MD 02/09/18 1310

## 2020-10-18 IMAGING — CT CT ANGIO CHEST
3 of 13 series · 17 of 46 positions shown · IV contrast (ISOVUE)
Comparison: Chest x-ray from same day.

CLINICAL DATA: Bilateral foot pain and swelling. Shortness of
breath.

EXAM:
CT ANGIOGRAPHY CHEST WITH CONTRAST
TECHNIQUE: Multidetector CT imaging of the chest was performed using the
standard protocol during bolus administration of intravenous
contrast. Multiplanar CT image reconstructions and MIPs were
obtained to evaluate the vascular anatomy.
CONTRAST:  100mL VURHYJ-MRT IOPAMIDOL (VURHYJ-MRT) INJECTION 76%,
100mL VURHYJ-MRT IOPAMIDOL (VURHYJ-MRT) INJECTION 76%

[Series 5: thins · axial · 0.98mm/px · z∈[+1158,+1403]mm · 8 of 315 slices shown (1 of 2)]
[im 35/315  lung]
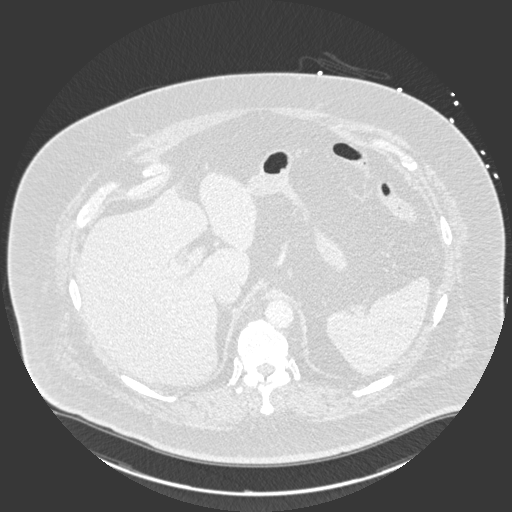
[im 70/315  soft-tissue]
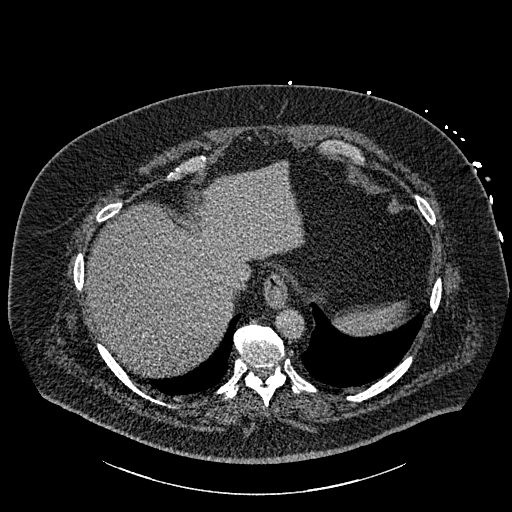
[im 105/315  lung]
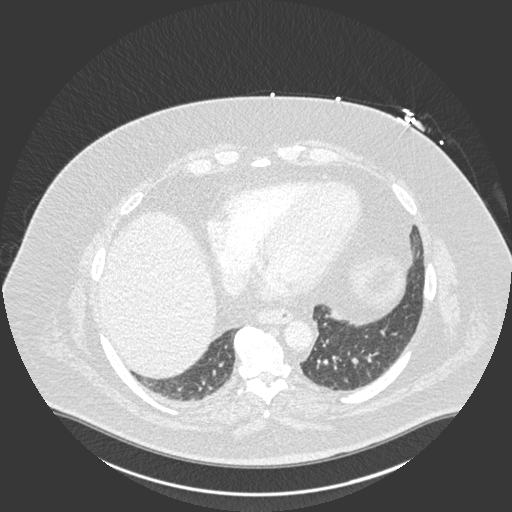
[im 140/315  soft-tissue]
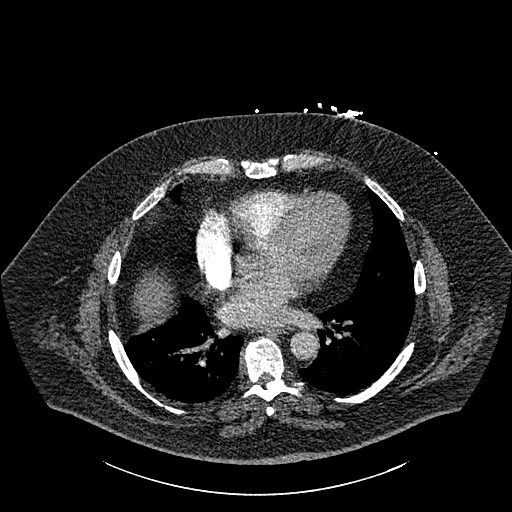
[im 175/315  lung]
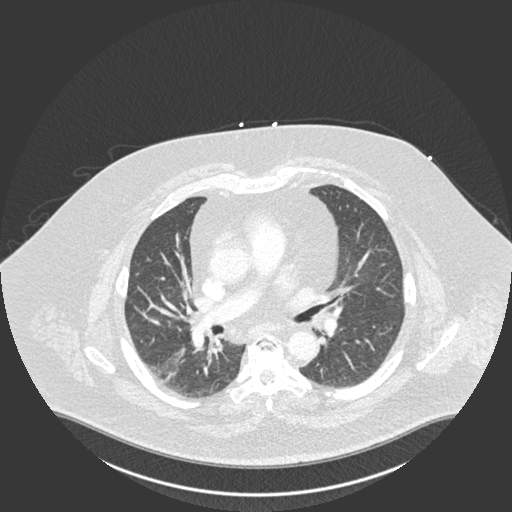
[im 210/315  soft-tissue]
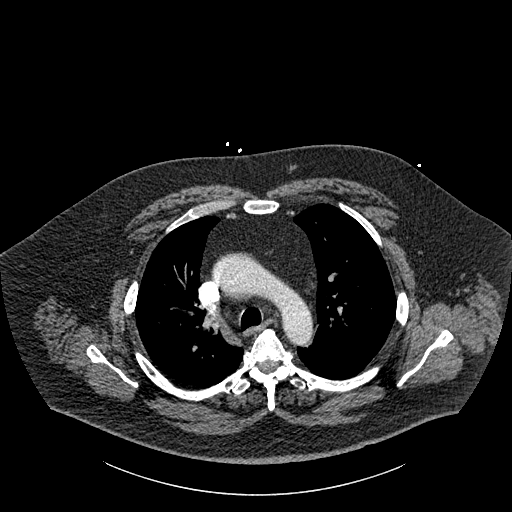
[im 245/315  lung]
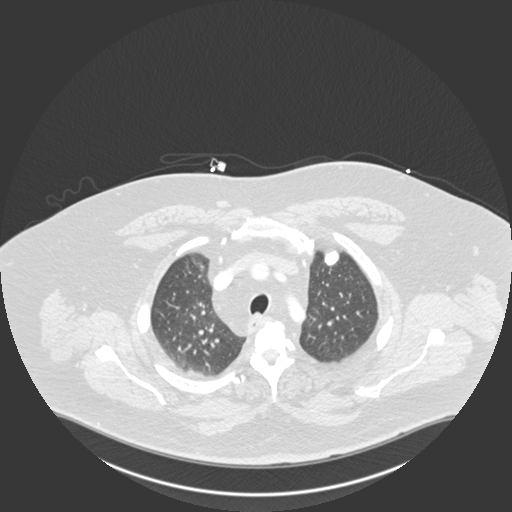
[im 280/315  soft-tissue]
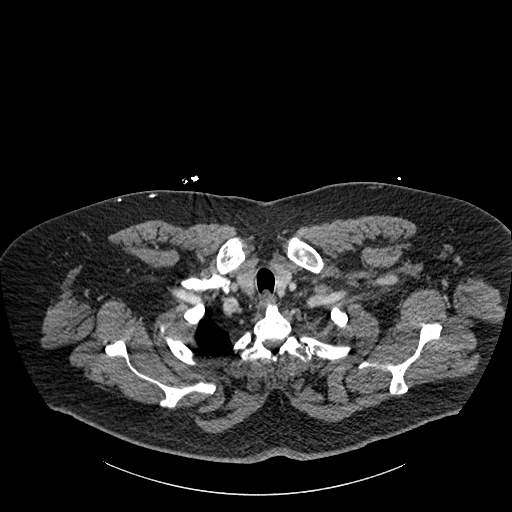

[Series 10: thins · axial · 0.93mm/px · z∈[+1149,+1399]mm · 8 of 322 slices shown (2 of 2)]
[im 36/322  lung]
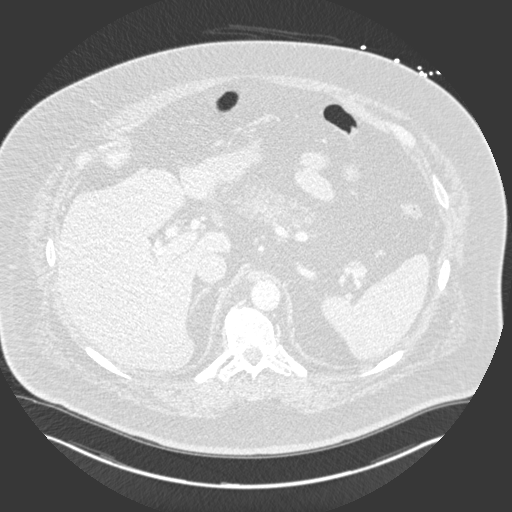
[im 72/322  lung]
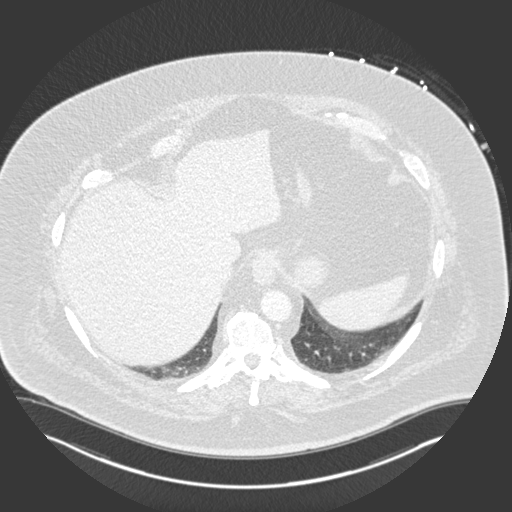
[im 108/322  lung]
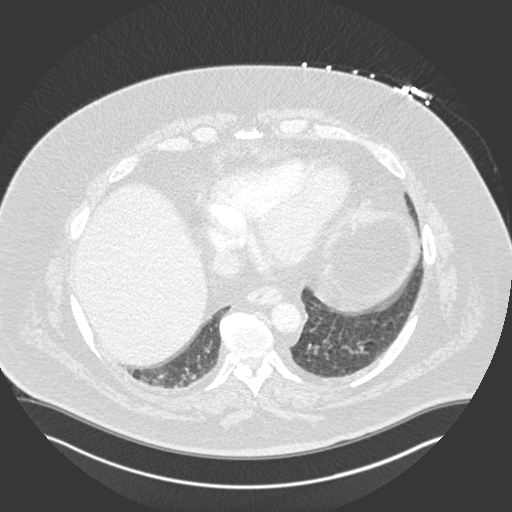
[im 143/322  lung]
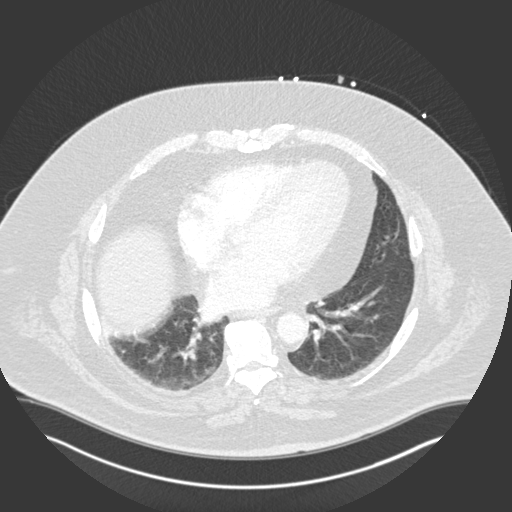
[im 179/322  lung]
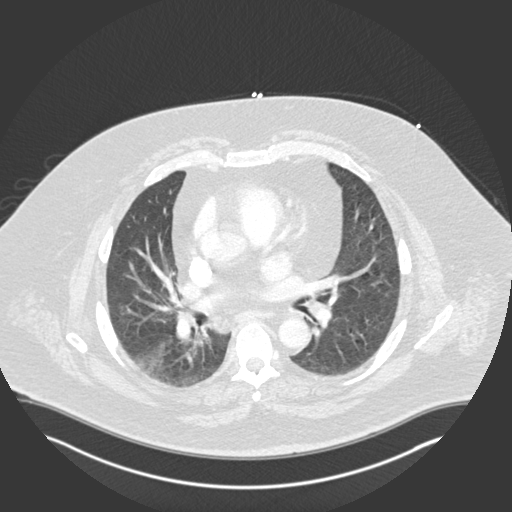
[im 215/322  lung]
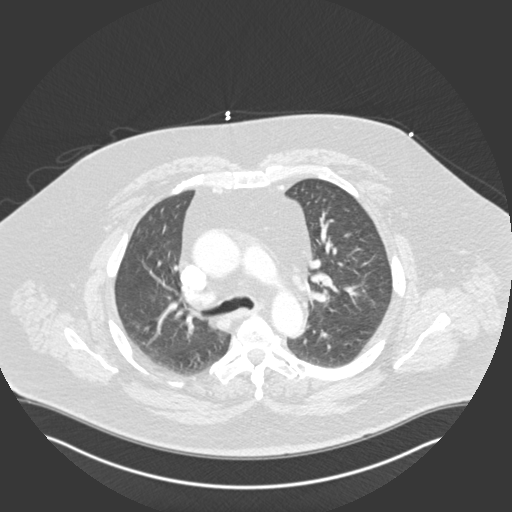
[im 250/322  lung]
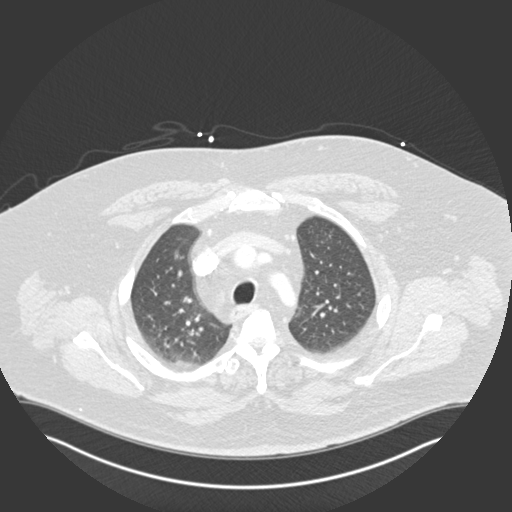
[im 286/322  lung]
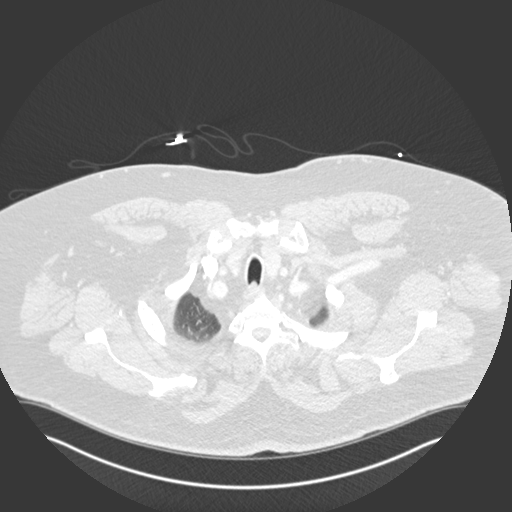

[Series 16: coronal mpr · coronal · 0.66mm/px · 1 of 210 slices shown]
[im 105/210  soft-tissue]
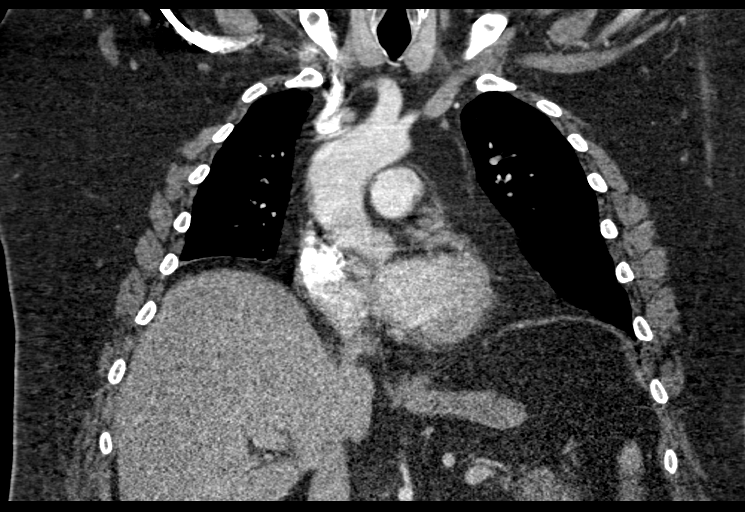

[17 of 46 positions shown; findings below may reference images not displayed]

FINDINGS: Cardiovascular: Despite two contrast boluses, there is poor
opacification of the segmental pulmonary arteries. No evidence of
central or lobar pulmonary embolism. Normal heart size. No
pericardial effusion. No thoracic aortic aneurysm.

Mediastinum/Nodes: No enlarged mediastinal, hilar, or axillary lymph
nodes. Thyroid gland, trachea, and esophagus demonstrate no
significant findings.

Lungs/Pleura: Subsegmental atelectasis in the right lower lobe. No
focal consolidation, pleural effusion, or pneumothorax. No
suspicious pulmonary nodule.

Upper Abdomen: No acute abnormality. Subcentimeter low-density
lesion in the left hepatic lobe is too small to characterize.

Musculoskeletal: No chest wall abnormality. No acute or significant
osseous findings. Chronic appearing mild T7 compression deformity.

Review of the MIP images confirms the above findings.
IMPRESSION: 1. No evidence of central or lobar pulmonary embolism. Segmental
pulmonary arteries are not well opacified.
2.  No acute intrathoracic process.

## 2024-01-21 ENCOUNTER — Other Ambulatory Visit: Payer: Self-pay

## 2024-01-21 ENCOUNTER — Emergency Department (HOSPITAL_COMMUNITY)
Admission: EM | Admit: 2024-01-21 | Discharge: 2024-01-22 | Disposition: A | Payer: Self-pay | Attending: Emergency Medicine | Admitting: Emergency Medicine

## 2024-01-21 ENCOUNTER — Encounter (HOSPITAL_COMMUNITY): Payer: Self-pay | Admitting: Emergency Medicine

## 2024-01-21 DIAGNOSIS — R42 Dizziness and giddiness: Secondary | ICD-10-CM | POA: Insufficient documentation

## 2024-01-21 DIAGNOSIS — I10 Essential (primary) hypertension: Secondary | ICD-10-CM | POA: Insufficient documentation

## 2024-01-21 DIAGNOSIS — R110 Nausea: Secondary | ICD-10-CM | POA: Insufficient documentation

## 2024-01-21 DIAGNOSIS — Z85828 Personal history of other malignant neoplasm of skin: Secondary | ICD-10-CM | POA: Insufficient documentation

## 2024-01-21 LAB — COMPREHENSIVE METABOLIC PANEL WITH GFR
ALT: 14 U/L (ref 0–44)
AST: 13 U/L — ABNORMAL LOW (ref 15–41)
Albumin: 2.6 g/dL — ABNORMAL LOW (ref 3.5–5.0)
Alkaline Phosphatase: 68 U/L (ref 38–126)
Anion gap: 4 — ABNORMAL LOW (ref 5–15)
BUN: 17 mg/dL (ref 8–23)
CO2: 23 mmol/L (ref 22–32)
Calcium: 6.6 mg/dL — ABNORMAL LOW (ref 8.9–10.3)
Chloride: 114 mmol/L — ABNORMAL HIGH (ref 98–111)
Creatinine, Ser: 1.04 mg/dL (ref 0.61–1.24)
GFR, Estimated: >60 mL/min (ref >60)
Glucose, Bld: 213 mg/dL — ABNORMAL HIGH (ref 70–99)
Potassium: 3.1 mmol/L — ABNORMAL LOW (ref 3.5–5.1)
Sodium: 141 mmol/L (ref 135–145)
Total Bilirubin: 0.9 mg/dL (ref 0.0–1.2)
Total Protein: 5.2 g/dL — ABNORMAL LOW (ref 6.5–8.1)

## 2024-01-21 LAB — CBC WITH DIFFERENTIAL/PLATELET
Abs Immature Granulocytes: 0.04 K/uL (ref 0.00–0.07)
Basophils Absolute: 0 K/uL (ref 0.0–0.1)
Basophils Relative: 1 %
Eosinophils Absolute: 0.2 K/uL (ref 0.0–0.5)
Eosinophils Relative: 2 %
HCT: 35 % — ABNORMAL LOW (ref 39.0–52.0)
Hemoglobin: 10.8 g/dL — ABNORMAL LOW (ref 13.0–17.0)
Immature Granulocytes: 1 %
Lymphocytes Relative: 16 %
Lymphs Abs: 1.2 K/uL (ref 0.7–4.0)
MCH: 25.8 pg — ABNORMAL LOW (ref 26.0–34.0)
MCHC: 30.9 g/dL (ref 30.0–36.0)
MCV: 83.7 fL (ref 80.0–100.0)
Monocytes Absolute: 0.5 K/uL (ref 0.1–1.0)
Monocytes Relative: 6 %
Neutro Abs: 5.6 K/uL (ref 1.7–7.7)
Neutrophils Relative %: 74 %
Platelets: 211 K/uL (ref 150–400)
RBC: 4.18 MIL/uL — ABNORMAL LOW (ref 4.22–5.81)
RDW: 14.2 % (ref 11.5–15.5)
WBC: 7.5 K/uL (ref 4.0–10.5)
nRBC: 0 % (ref 0.0–0.2)

## 2024-01-21 LAB — CBG MONITORING, ED: Glucose-Capillary: 204 mg/dL — ABNORMAL HIGH (ref 70–99)

## 2024-01-21 NOTE — ED Provider Triage Note (Signed)
 Emergency Medicine Provider Triage Evaluation Note  Duane Bishop , a 63 y.o. male  was evaluated in triage.  Pt complains of an episode of dizziness that started earlier today at bingo.  Reports he was looking at his bingo card when his vision got slightly blurred.  He also felt nauseous during this episode.  States that when he walked outside got some fresh air, his symptoms seem to resolved.  Denies any double vision.  No arm or leg numbness or weakness.  No slurred speech, facial droop.  No chest pain or shortness of breath  Review of Systems  Positive: As above Negative: As above  Physical Exam  BP (!) 143/62   Pulse 79   Temp 98.2 F (36.8 C) (Oral)   Resp 20   Ht 6' 6 (1.981 m)   Wt (!) 158.8 kg   SpO2 99%   BMI 40.45 kg/m  Gen:   Awake, no distress   Resp:  Normal effort  MSK:   Moves extremities without difficulty  Other:  No neurologic deficits on exam  Medical Decision Making  Medically screening exam initiated at 8:12 PM.  Appropriate orders placed.  Duane Bishop was informed that the remainder of the evaluation will be completed by another provider, this initial triage assessment does not replace that evaluation, and the importance of remaining in the ED until their evaluation is complete.     Veta Palma, PA-C 01/21/24 2012

## 2024-01-21 NOTE — ED Triage Notes (Signed)
 PT BIB EMS from [place]ingo hall with c/o dizziness and nausea, states that he started to feel better after getting on the truck. BLE swelling.  138/78 70hr 100ra

## 2024-01-22 MED ORDER — MECLIZINE HCL 25 MG PO TABS: 25.0000 mg | ORAL_TABLET | Freq: Three times a day (TID) | ORAL | 0 refills | Status: AC | PRN

## 2024-01-22 NOTE — Discharge Instructions (Signed)
 You were evaluated in the Emergency Department and after careful evaluation, we did not find any emergent condition requiring admission or further testing in the hospital.  Your exam/testing today is overall reassuring.  Symptoms seem to be due to a spell of vertigo.  Can use the meclizine medication provided as needed at home, follow-up with your primary care doctor.  Please return to the Emergency Department if you experience any worsening of your condition.   Thank you for allowing us  to be a part of your care.

## 2024-01-22 NOTE — ED Provider Notes (Signed)
 [facility]ERGENC[facility] DEPT [facility] Emergency Department Provider Note MRN:  [id number]  Arrival date & time: 01/22/24     Chief Complaint   Dizziness   History of Present Illness   Duane Bishop is a 63 y.o. year-old male with a history of hypertension presenting to the ED with chief complaint of dizziness.  Patient was playing bingo tonight and ate a few hot dogs and then suddenly he felt like his eyes were twitching back-and-forth and he suddenly felt dizzy and very nauseated.  He was playing bingo next to a nurse who evaluated him, he seemed to have poor color and was clammy.  Patient denies having any chest pain or shortness of breath during this episode, no numbness or weakness to the arms or legs, no trouble with speech or vision.  When he got up into the cold air he felt better and now he feels normal.  Review of Systems  A thorough review of systems was obtained and all systems are negative except as noted in the HPI and PMH.   Patient's Health History    Past Medical History:  Diagnosis Date   Basal cell carcinoma 05/20/2012   RIGHT OUTER EYE -TX WITH BX   BCC (basal cell carcinoma of skin) 05/20/2012   RIGHT SIDE NECK SUP- TX WITH BX   BCC (basal cell carcinoma of skin) 05/20/2012   RIGHT SIDE OF NECK MED -TX WITH BX   BCC (basal cell carcinoma of skin) 05/20/2012   RIGHT SIDE OF NECK INF - TX WITH BX   Hypertension    Kidney stones    Melanoma (HCC) 05/20/2012   MM IN SITU RIGHT UPPER BACK (MARGIN FREE 06/25/2012)    Past Surgical History:  Procedure Laterality Date   ANKLE SURGERY     LITHOTRIPSY      Family History  Problem Relation Age of Onset   Hypertension Mother    Hypertension Father     Social History   Socioeconomic History   Marital status: Divorced    Spouse name: Not on file   Number of children: Not on file   Years of education: Not on file   Highest education level: Not on file  Occupational History   Not on file  Tobacco Use    Smoking status: Never   Smokeless tobacco: Never  Substance and Sexual Activity   Alcohol use: No   Drug use: No   Sexual activity: Not Currently  Other Topics Concern   Not on file  Social History Narrative   Not on file   Social Drivers of Health   Financial Resource Strain: Not on file  Food Insecurity: Not on file  Transportation Needs: Not on file  Physical Activity: Not on file  Stress: Not on file  Social Connections: Not on file  Intimate Partner Violence: Not on file     Physical Exam   Vitals:   01/21/24 2348 01/22/24 0232  BP: 100/82 (!) 140/62  Pulse: 72 74  Resp: 16 18  Temp: 98.4 F (36.9 C) 98 F (36.7 C)  SpO2: 97% 100%    CONSTITUTIONAL: Well-appearing, NAD NEURO/PSYCH:  Alert and oriented x 3, normal and symmetric strength and sensation, normal coordination, normal speech EYES:  eyes equal and reactive ENT/NECK:  no LAD, no JVD CARDIO: Regular rate, well-perfused, normal S1 and S2 PULM:  CTAB no wheezing or rhonchi GI/GU:  non-distended, non-tender MSK/SPINE:  No gross deformities, no edema SKIN:  no rash, atraumatic   *Additional  and/or pertinent findings included in MDM below  Diagnostic and Interventional Summary    EKG Interpretation Date/Time:  Thursday January 22 2024 02:39:44 EST Ventricular Rate:  72 PR Interval:  210 QRS Duration:  162 QT Interval:  446 QTC Calculation: 488 R Axis:   -70  Text Interpretation: Sinus rhythm with 1st degree A-V block Left axis deviation Right bundle branch block Abnormal ECG When compared with ECG of 09-Feb-2018 10:12, PREVIOUS ECG IS PRESENT Confirmed by Theadore Sharper [phone]) on 01/22/2024 3:08:30 AM       Labs Reviewed  CBC WITH DIFFERENTIAL/PLATELET - Abnormal; Notable for the following components:      Result Value   RBC 4.18 (*)    Hemoglobin 10.8 (*)    HCT 35.0 (*)    MCH 25.8 (*)    All other components within normal limits  COMPREHENSIVE METABOLIC PANEL WITH GFR - Abnormal;  Notable for the following components:   Potassium 3.1 (*)    Chloride 114 (*)    Glucose, Bld 213 (*)    Calcium  6.6 (*)    Total Protein 5.2 (*)    Albumin 2.6 (*)    AST 13 (*)    Anion gap 4 (*)    All other components within normal limits  CBG MONITORING, ED - Abnormal; Notable for the following components:   Glucose-Capillary 204 (*)    All other components within normal limits    No orders to display    Medications - No data to display   Procedures  /  Critical Care Procedures  ED Course and Medical Decision Making  Initial Impression and Ddx Symptoms seem most suspicious for an episode of vertigo with nystagmus.  He describes the initial sensation as his eyes moving back-and-forth rapidly with dizziness, with nausea.  His sister at bedside explains she has the same type of episodes and their mother does 2.  Patient currently has no nystagmus, no neurological deficits, no symptoms.  Doubt central vertigo.  Electrolyte disturbance is another consideration.  Doubt ACS, no chest pain.  Past medical/surgical history that increases complexity of ED encounter: None  Interpretation of Diagnostics I personally reviewed the Laboratory Testing and my interpretation is as follows: No significant blood count or electrolyte disturbance.  I personally evaluated EKG as well, sinus rhythm with bundle branch block, unchanged from prior.  Patient Reassessment and Ultimate Disposition/Management     No emergent process, plan is for discharge with PCP follow-up.  Patient management required discussion with the following services or consulting groups:  None  Complexity of Problems Addressed Acute illness or injury that poses threat of life of bodily function  Additional Data Reviewed and Analyzed Further history obtained from: Further history from spouse/family member  Additional Factors Impacting ED Encounter Risk Consideration of hospitalization  Sharper HERO. Theadore, MD [facility]  Emergency Medicine [facility] [email or web address]  Final Clinical Impressions(s) / ED Diagnoses     ICD-10-CM   1. Nausea  R11.0       ED Discharge Orders          Ordered    meclizine (ANTIVERT) 25 MG tablet  3 times daily PRN        01/22/24 0309             Discharge Instructions Discussed with and Provided to Patient:     Discharge Instructions      You were evaluated in the Emergency Department and after careful evaluation, we did not find any  emergent condition requiring admission or further testing in the hospital.  Your exam/testing today is overall reassuring.  Symptoms seem to be due to a spell of vertigo.  Can use the meclizine medication provided as needed at home, follow-up with your primary care doctor.  Please return to the Emergency Department if you experience any worsening of your condition.   Thank you for allowing us  to be a part of your care.       Theadore Ozell HERO, MD 01/22/24 [phone]
